# Patient Record
Sex: Male | Born: 1935 | Race: White | Hispanic: No | Marital: Married | Smoking: Never smoker
Health system: Southern US, Community
[De-identification: ages and names within clinical notes are randomized; demographics above are authoritative.]

## PROBLEM LIST (undated history)

## (undated) DIAGNOSIS — C649 Malignant neoplasm of unspecified kidney, except renal pelvis: Secondary | ICD-10-CM

## (undated) DIAGNOSIS — R413 Other amnesia: Secondary | ICD-10-CM

## (undated) DIAGNOSIS — B009 Herpesviral infection, unspecified: Secondary | ICD-10-CM

## (undated) DIAGNOSIS — G309 Alzheimer's disease, unspecified: Secondary | ICD-10-CM

## (undated) DIAGNOSIS — E119 Type 2 diabetes mellitus without complications: Secondary | ICD-10-CM

## (undated) DIAGNOSIS — F0280 Dementia in other diseases classified elsewhere without behavioral disturbance: Secondary | ICD-10-CM

## (undated) DIAGNOSIS — Z8614 Personal history of Methicillin resistant Staphylococcus aureus infection: Secondary | ICD-10-CM

## (undated) DIAGNOSIS — E785 Hyperlipidemia, unspecified: Secondary | ICD-10-CM

## (undated) HISTORY — DX: Type 2 diabetes mellitus without complications: E11.9

## (undated) HISTORY — PX: TONSILLECTOMY: SUR1361

## (undated) HISTORY — DX: Malignant neoplasm of unspecified kidney, except renal pelvis: C64.9

## (undated) HISTORY — PX: NEPHRECTOMY: SHX65

## (undated) HISTORY — DX: Personal history of Methicillin resistant Staphylococcus aureus infection: Z86.14

## (undated) HISTORY — DX: Herpesviral infection, unspecified: B00.9

## (undated) HISTORY — DX: Alzheimer's disease, unspecified: G30.9

## (undated) HISTORY — DX: Hyperlipidemia, unspecified: E78.5

## (undated) HISTORY — DX: Dementia in other diseases classified elsewhere, unspecified severity, without behavioral disturbance, psychotic disturbance, mood disturbance, and anxiety: F02.80

## (undated) HISTORY — DX: Other amnesia: R41.3

---

## 2004-08-06 ENCOUNTER — Ambulatory Visit: Payer: Self-pay | Admitting: Internal Medicine

## 2004-08-16 ENCOUNTER — Ambulatory Visit: Payer: Self-pay | Admitting: Internal Medicine

## 2004-12-19 ENCOUNTER — Ambulatory Visit: Payer: Self-pay | Admitting: Internal Medicine

## 2005-01-01 ENCOUNTER — Ambulatory Visit: Payer: Self-pay | Admitting: Internal Medicine

## 2005-01-10 ENCOUNTER — Ambulatory Visit: Payer: Self-pay | Admitting: Internal Medicine

## 2005-02-14 ENCOUNTER — Ambulatory Visit: Payer: Self-pay | Admitting: Internal Medicine

## 2005-03-21 ENCOUNTER — Ambulatory Visit: Payer: Self-pay | Admitting: Internal Medicine

## 2005-06-20 ENCOUNTER — Ambulatory Visit: Payer: Self-pay | Admitting: Internal Medicine

## 2005-07-18 ENCOUNTER — Ambulatory Visit: Payer: Self-pay | Admitting: Gastroenterology

## 2005-08-01 ENCOUNTER — Encounter: Payer: Self-pay | Admitting: Internal Medicine

## 2005-08-01 ENCOUNTER — Ambulatory Visit: Payer: Self-pay | Admitting: Gastroenterology

## 2005-08-01 ENCOUNTER — Encounter (INDEPENDENT_AMBULATORY_CARE_PROVIDER_SITE_OTHER): Payer: Self-pay | Admitting: *Deleted

## 2005-08-26 ENCOUNTER — Ambulatory Visit: Payer: Self-pay | Admitting: Internal Medicine

## 2005-12-19 ENCOUNTER — Ambulatory Visit: Payer: Self-pay | Admitting: Internal Medicine

## 2006-03-20 ENCOUNTER — Ambulatory Visit: Payer: Self-pay | Admitting: Internal Medicine

## 2006-05-01 ENCOUNTER — Ambulatory Visit: Payer: Self-pay | Admitting: Internal Medicine

## 2006-05-29 ENCOUNTER — Ambulatory Visit: Payer: Self-pay | Admitting: Internal Medicine

## 2006-07-17 ENCOUNTER — Ambulatory Visit: Payer: Self-pay | Admitting: Internal Medicine

## 2006-07-17 LAB — CONVERTED CEMR LAB
Creatinine, Ser: 1 mg/dL (ref 0.4–1.5)
Hgb A1c MFr Bld: 7.4 % — ABNORMAL HIGH (ref 4.6–6.0)

## 2006-12-25 ENCOUNTER — Ambulatory Visit: Payer: Self-pay | Admitting: Internal Medicine

## 2006-12-25 LAB — CONVERTED CEMR LAB
ALT: 21 units/L (ref 0–40)
AST: 34 units/L (ref 0–37)
Albumin: 3.8 g/dL (ref 3.5–5.2)
Basophils Absolute: 0 10*3/uL (ref 0.0–0.1)
Basophils Relative: 1 % (ref 0.0–1.0)
CO2: 30 meq/L (ref 19–32)
Cholesterol: 184 mg/dL (ref 0–200)
Creatinine, Ser: 1 mg/dL (ref 0.4–1.5)
Eosinophils Absolute: 0.3 10*3/uL (ref 0.0–0.6)
HDL: 49.4 mg/dL (ref >39.0)
Hemoglobin: 13.3 g/dL (ref 13.0–17.0)
Hgb A1c MFr Bld: 7.4 % — ABNORMAL HIGH (ref 4.6–6.0)
LDL Cholesterol: 116 mg/dL — ABNORMAL HIGH (ref 0–99)
Lymphocytes Relative: 30.4 % (ref 12.0–46.0)
MCHC: 34.5 g/dL (ref 30.0–36.0)
MCV: 99.7 fL (ref 78.0–100.0)
Microalb Creat Ratio: 2.6 mg/g (ref 0.0–30.0)
Microalb, Ur: 0.3 mg/dL (ref 0.0–1.9)
Monocytes Absolute: 0.4 10*3/uL (ref 0.2–0.7)
Monocytes Relative: 9.5 % (ref 3.0–11.0)
Neutrophils Relative %: 52.2 % (ref 43.0–77.0)
PSA: 1.58 ng/mL (ref 0.10–4.00)
Platelets: 197 10*3/uL (ref 150–400)
RDW: 12.5 % (ref 11.5–14.6)
TSH: 1.99 microintl units/mL (ref 0.35–5.50)

## 2007-01-13 ENCOUNTER — Ambulatory Visit: Payer: Self-pay | Admitting: Internal Medicine

## 2007-02-13 DIAGNOSIS — B009 Herpesviral infection, unspecified: Secondary | ICD-10-CM

## 2007-02-19 ENCOUNTER — Ambulatory Visit: Payer: Self-pay | Admitting: Internal Medicine

## 2007-02-19 DIAGNOSIS — L600 Ingrowing nail: Secondary | ICD-10-CM | POA: Insufficient documentation

## 2007-02-19 DIAGNOSIS — E1165 Type 2 diabetes mellitus with hyperglycemia: Secondary | ICD-10-CM | POA: Insufficient documentation

## 2007-02-19 DIAGNOSIS — E785 Hyperlipidemia, unspecified: Secondary | ICD-10-CM

## 2007-03-11 ENCOUNTER — Encounter: Payer: Self-pay | Admitting: Internal Medicine

## 2007-03-21 ENCOUNTER — Encounter: Payer: Self-pay | Admitting: Internal Medicine

## 2007-09-08 ENCOUNTER — Ambulatory Visit: Payer: Self-pay | Admitting: Internal Medicine

## 2007-09-08 DIAGNOSIS — T887XXA Unspecified adverse effect of drug or medicament, initial encounter: Secondary | ICD-10-CM

## 2007-09-08 LAB — CONVERTED CEMR LAB
BUN: 16 mg/dL (ref 6–23)
Basophils Relative: 1.2 % — ABNORMAL HIGH (ref 0.0–1.0)
CO2: 31 meq/L (ref 19–32)
Calcium: 9.7 mg/dL (ref 8.4–10.5)
Creatinine, Ser: 1 mg/dL (ref 0.4–1.5)
GFR calc Af Amer: 95 mL/min
Monocytes Relative: 9.2 % (ref 3.0–11.0)
Platelets: 190 10*3/uL (ref 150–400)
RBC: 4.08 M/uL — ABNORMAL LOW (ref 4.22–5.81)
RDW: 12.7 % (ref 11.5–14.6)

## 2008-01-21 ENCOUNTER — Ambulatory Visit: Payer: Self-pay | Admitting: Internal Medicine

## 2008-01-21 DIAGNOSIS — R972 Elevated prostate specific antigen [PSA]: Secondary | ICD-10-CM

## 2008-01-28 ENCOUNTER — Ambulatory Visit: Payer: Self-pay | Admitting: Internal Medicine

## 2008-03-08 ENCOUNTER — Encounter: Payer: Self-pay | Admitting: Internal Medicine

## 2008-07-26 ENCOUNTER — Ambulatory Visit: Payer: Self-pay | Admitting: Internal Medicine

## 2008-07-26 DIAGNOSIS — F0280 Dementia in other diseases classified elsewhere without behavioral disturbance: Secondary | ICD-10-CM | POA: Insufficient documentation

## 2008-07-26 DIAGNOSIS — G309 Alzheimer's disease, unspecified: Secondary | ICD-10-CM

## 2009-02-28 ENCOUNTER — Ambulatory Visit: Payer: Self-pay | Admitting: Internal Medicine

## 2009-03-02 ENCOUNTER — Encounter: Admission: RE | Admit: 2009-03-02 | Discharge: 2009-03-02 | Payer: Self-pay | Admitting: Internal Medicine

## 2009-03-07 ENCOUNTER — Ambulatory Visit: Payer: Self-pay | Admitting: Internal Medicine

## 2009-03-07 LAB — CONVERTED CEMR LAB: Blood Glucose, Fingerstick: 450

## 2009-03-14 ENCOUNTER — Ambulatory Visit: Payer: Self-pay | Admitting: Internal Medicine

## 2009-03-21 ENCOUNTER — Telehealth: Payer: Self-pay | Admitting: Internal Medicine

## 2009-03-27 ENCOUNTER — Encounter: Payer: Self-pay | Admitting: Internal Medicine

## 2009-04-04 ENCOUNTER — Ambulatory Visit: Payer: Self-pay | Admitting: Internal Medicine

## 2009-05-09 ENCOUNTER — Encounter: Payer: Self-pay | Admitting: Internal Medicine

## 2009-05-11 ENCOUNTER — Telehealth: Payer: Self-pay | Admitting: Internal Medicine

## 2009-05-29 ENCOUNTER — Encounter (INDEPENDENT_AMBULATORY_CARE_PROVIDER_SITE_OTHER): Payer: Self-pay | Admitting: *Deleted

## 2009-06-06 ENCOUNTER — Ambulatory Visit: Payer: Self-pay | Admitting: Internal Medicine

## 2009-06-06 LAB — CONVERTED CEMR LAB
Alkaline Phosphatase: 71 units/L (ref 39–117)
BUN: 19 mg/dL (ref 6–23)
Bilirubin, Direct: 0.2 mg/dL (ref 0.0–0.3)
CO2: 29 meq/L (ref 19–32)
Chloride: 107 meq/L (ref 96–112)
Creatinine, Ser: 1.2 mg/dL (ref 0.4–1.5)
Folate: 13.5 ng/mL
Glucose, Bld: 143 mg/dL — ABNORMAL HIGH (ref 70–99)
Hgb A1c MFr Bld: 7.3 % — ABNORMAL HIGH (ref 4.6–6.5)
Potassium: 4.4 meq/L (ref 3.5–5.1)
Total Bilirubin: 1.6 mg/dL — ABNORMAL HIGH (ref 0.3–1.2)
Total Protein: 7.2 g/dL (ref 6.0–8.3)

## 2009-08-08 ENCOUNTER — Ambulatory Visit: Payer: Self-pay | Admitting: Internal Medicine

## 2009-08-08 DIAGNOSIS — E162 Hypoglycemia, unspecified: Secondary | ICD-10-CM | POA: Insufficient documentation

## 2009-08-08 LAB — CONVERTED CEMR LAB: Hgb A1c MFr Bld: 7.2 % — ABNORMAL HIGH (ref 4.6–6.5)

## 2009-08-15 ENCOUNTER — Encounter: Admission: RE | Admit: 2009-08-15 | Discharge: 2009-08-15 | Payer: Self-pay | Admitting: Internal Medicine

## 2009-08-16 ENCOUNTER — Encounter: Payer: Self-pay | Admitting: Internal Medicine

## 2009-08-21 ENCOUNTER — Telehealth: Payer: Self-pay | Admitting: Internal Medicine

## 2009-09-05 ENCOUNTER — Ambulatory Visit: Payer: Self-pay | Admitting: Internal Medicine

## 2009-09-05 DIAGNOSIS — L719 Rosacea, unspecified: Secondary | ICD-10-CM

## 2009-10-24 ENCOUNTER — Telehealth: Payer: Self-pay | Admitting: Internal Medicine

## 2009-10-31 ENCOUNTER — Encounter: Payer: Self-pay | Admitting: Internal Medicine

## 2009-11-28 ENCOUNTER — Ambulatory Visit: Payer: Self-pay | Admitting: Internal Medicine

## 2009-11-28 DIAGNOSIS — Z8614 Personal history of Methicillin resistant Staphylococcus aureus infection: Secondary | ICD-10-CM | POA: Insufficient documentation

## 2009-11-28 HISTORY — DX: Personal history of Methicillin resistant Staphylococcus aureus infection: Z86.14

## 2009-12-22 ENCOUNTER — Telehealth: Payer: Self-pay | Admitting: Internal Medicine

## 2010-01-08 ENCOUNTER — Telehealth: Payer: Self-pay | Admitting: Internal Medicine

## 2010-01-20 ENCOUNTER — Encounter: Payer: Self-pay | Admitting: Internal Medicine

## 2010-01-23 ENCOUNTER — Ambulatory Visit: Payer: Self-pay | Admitting: Internal Medicine

## 2010-01-23 DIAGNOSIS — IMO0002 Reserved for concepts with insufficient information to code with codable children: Secondary | ICD-10-CM | POA: Insufficient documentation

## 2010-01-23 DIAGNOSIS — R3919 Other difficulties with micturition: Secondary | ICD-10-CM | POA: Insufficient documentation

## 2010-01-23 DIAGNOSIS — K3184 Gastroparesis: Secondary | ICD-10-CM

## 2010-01-23 LAB — CONVERTED CEMR LAB
ALT: 24 units/L (ref 0–53)
AST: 31 units/L (ref 0–37)
Alkaline Phosphatase: 79 units/L (ref 39–117)
BUN: 24 mg/dL — ABNORMAL HIGH (ref 6–23)
Basophils Absolute: 0 10*3/uL (ref 0.0–0.1)
Bilirubin Urine: NEGATIVE
Chloride: 107 meq/L (ref 96–112)
Creatinine, Ser: 0.9 mg/dL (ref 0.4–1.5)
Eosinophils Relative: 3.1 % (ref 0.0–5.0)
GFR calc non Af Amer: 90.45 mL/min (ref >60)
Hemoglobin: 14.5 g/dL (ref 13.0–17.0)
Hgb A1c MFr Bld: 8.9 % — ABNORMAL HIGH (ref 4.6–6.5)
Ketones, urine, test strip: NEGATIVE
Lymphocytes Relative: 19.8 % (ref 12.0–46.0)
Monocytes Relative: 7.4 % (ref 3.0–12.0)
Nitrite: NEGATIVE
Platelets: 196 10*3/uL (ref 150.0–400.0)
Potassium: 4.4 meq/L (ref 3.5–5.1)
RDW: 14.1 % (ref 11.5–14.6)
Specific Gravity, Urine: >=1.03
Total Bilirubin: 1.6 mg/dL — ABNORMAL HIGH (ref 0.3–1.2)
WBC: 6.2 10*3/uL (ref 4.5–10.5)
pH: 5.5

## 2010-01-31 ENCOUNTER — Encounter: Payer: Self-pay | Admitting: Internal Medicine

## 2010-01-31 ENCOUNTER — Encounter: Admission: RE | Admit: 2010-01-31 | Discharge: 2010-02-22 | Payer: Self-pay | Admitting: Internal Medicine

## 2010-02-14 ENCOUNTER — Telehealth: Payer: Self-pay | Admitting: Internal Medicine

## 2010-02-15 ENCOUNTER — Ambulatory Visit: Payer: Self-pay | Admitting: Family Medicine

## 2010-02-15 ENCOUNTER — Encounter: Payer: Self-pay | Admitting: Internal Medicine

## 2010-02-15 DIAGNOSIS — Z9189 Other specified personal risk factors, not elsewhere classified: Secondary | ICD-10-CM

## 2010-02-16 ENCOUNTER — Ambulatory Visit: Payer: Self-pay | Admitting: Cardiovascular Disease

## 2010-02-16 ENCOUNTER — Telehealth (INDEPENDENT_AMBULATORY_CARE_PROVIDER_SITE_OTHER): Payer: Self-pay | Admitting: *Deleted

## 2010-02-16 ENCOUNTER — Ambulatory Visit: Payer: Self-pay | Admitting: Family Medicine

## 2010-02-16 DIAGNOSIS — L259 Unspecified contact dermatitis, unspecified cause: Secondary | ICD-10-CM

## 2010-02-16 DIAGNOSIS — K5900 Constipation, unspecified: Secondary | ICD-10-CM | POA: Insufficient documentation

## 2010-02-16 DIAGNOSIS — C649 Malignant neoplasm of unspecified kidney, except renal pelvis: Secondary | ICD-10-CM

## 2010-02-19 ENCOUNTER — Ambulatory Visit: Payer: Self-pay | Admitting: Family Medicine

## 2010-03-14 ENCOUNTER — Encounter: Payer: Self-pay | Admitting: Internal Medicine

## 2010-03-25 ENCOUNTER — Emergency Department (HOSPITAL_COMMUNITY): Admission: EM | Admit: 2010-03-25 | Discharge: 2010-03-25 | Payer: Self-pay | Admitting: Emergency Medicine

## 2010-03-26 ENCOUNTER — Ambulatory Visit (HOSPITAL_COMMUNITY): Admission: RE | Admit: 2010-03-26 | Discharge: 2010-03-26 | Payer: Self-pay | Admitting: Urology

## 2010-04-11 ENCOUNTER — Encounter: Payer: Self-pay | Admitting: Urology

## 2010-04-11 ENCOUNTER — Inpatient Hospital Stay (HOSPITAL_COMMUNITY): Admission: RE | Admit: 2010-04-11 | Discharge: 2010-04-13 | Payer: Self-pay | Admitting: Urology

## 2010-04-11 ENCOUNTER — Encounter: Payer: Self-pay | Admitting: Internal Medicine

## 2010-04-20 ENCOUNTER — Encounter: Payer: Self-pay | Admitting: Internal Medicine

## 2010-05-23 ENCOUNTER — Encounter: Payer: Self-pay | Admitting: Internal Medicine

## 2010-06-28 ENCOUNTER — Ambulatory Visit: Payer: Self-pay | Admitting: Internal Medicine

## 2010-07-25 ENCOUNTER — Encounter: Payer: Self-pay | Admitting: Gastroenterology

## 2010-08-09 ENCOUNTER — Encounter: Payer: Self-pay | Admitting: Internal Medicine

## 2010-08-12 LAB — CONVERTED CEMR LAB
ALT: 22 units/L (ref 0–53)
ALT: 23 units/L (ref 0–53)
AST: 28 units/L (ref 0–37)
BUN: 14 mg/dL (ref 6–23)
Basophils Absolute: 0 10*3/uL (ref 0.0–0.1)
Basophils Relative: 0.8 % (ref 0.0–3.0)
Bilirubin Urine: NEGATIVE
Bilirubin, Direct: 0 mg/dL (ref 0.0–0.3)
Bilirubin, Direct: 0.1 mg/dL (ref 0.0–0.3)
CO2: 29 meq/L (ref 19–32)
CO2: 31 meq/L (ref 19–32)
CRP, High Sensitivity: <1 (ref 0.00–5.00)
Calcium: 9.2 mg/dL (ref 8.4–10.5)
Chloride: 105 meq/L (ref 96–112)
Cholesterol: 157 mg/dL (ref 0–200)
Creatinine, Ser: 0.9 mg/dL (ref 0.4–1.5)
Eosinophils Relative: 5.1 % — ABNORMAL HIGH (ref 0.0–5.0)
GFR calc Af Amer: 107 mL/min
GFR calc non Af Amer: 89 mL/min
HCT: 42.4 % (ref 39.0–52.0)
HDL: 56.2 mg/dL (ref >39.00)
Hemoglobin: 13.5 g/dL (ref 13.0–17.0)
Hgb A1c MFr Bld: 8.9 % — ABNORMAL HIGH (ref 4.6–6.0)
LDL Cholesterol: 96 mg/dL (ref 0–99)
Lymphocytes Relative: 26.4 % (ref 12.0–46.0)
MCHC: 35.3 g/dL (ref 30.0–36.0)
MCV: 102 fL — ABNORMAL HIGH (ref 78.0–100.0)
Microalb Creat Ratio: 3.6 mg/g (ref 0.0–30.0)
Microalb, Ur: 0.2 mg/dL (ref 0.0–1.9)
Monocytes Absolute: 0.4 10*3/uL (ref 0.1–1.0)
Monocytes Relative: 9.1 % (ref 3.0–12.0)
Neutro Abs: 2.5 10*3/uL (ref 1.4–7.7)
Neutrophils Relative %: 59.2 % (ref 43.0–77.0)
PSA: 1.01 ng/mL (ref 0.10–4.00)
PSA: 1.42 ng/mL (ref 0.10–4.00)
RBC: 4.15 M/uL — ABNORMAL LOW (ref 4.22–5.81)
RDW: 12.8 % (ref 11.5–14.6)
Sodium: 140 meq/L (ref 135–145)
TSH: 1.71 microintl units/mL (ref 0.35–5.50)
Total Bilirubin: 1.5 mg/dL — ABNORMAL HIGH (ref 0.3–1.2)
Total Bilirubin: 1.5 mg/dL — ABNORMAL HIGH (ref 0.3–1.2)
Total Protein, Urine: NEGATIVE mg/dL
Total Protein: 6.9 g/dL (ref 6.0–8.3)
Triglycerides: 61 mg/dL (ref 0–149)
Urine Glucose: >=1000 mg/dL
VLDL: 12 mg/dL (ref 0–40)
Vitamin B-12: 993 pg/mL — ABNORMAL HIGH (ref 211–911)
WBC: 4.5 10*3/uL (ref 4.5–10.5)
pH: 5 (ref 5.0–8.0)

## 2010-08-14 NOTE — Assessment & Plan Note (Signed)
Summary: 1 month fup//ccm   Vital Signs:  Patient profile:   75 year old male Height:      69 inches Weight:      184 pounds BMI:     27.27 Temp:     98.2 degrees F oral Pulse rate:   72 / minute Resp:     14 per minute BP sitting:   129 / 72  (left arm)  Vitals Entered By: Willy Eddy, LPN (September 05, 2009 9:10 AM) CC: roa- pt states he is taking a phenergan supp. about every day to nausea and he takes with him if gets nauseated Pain Assessment Patient in pain? no       Does patient need assistance? Functional Status Self care, Cook/clean, Shopping, Social activities Ambulation Normal   Primary Care Provider:  Stacie Glaze MD  CC:  roa- pt states he is taking a phenergan supp. about every day to nausea and he takes with him if gets nauseated.  History of Present Illness: When to the [facility] for training but did not recall the event fully ( OBS) he brings a record of CBG's most are +- 20 of 100! bed time snaks help him to avoid AM lows one treatable low was reported hx of type 2   Preventive Screening-Counseling & Management  Alcohol-Tobacco     Smoking Status: never  Problems Prior to Update: 1)  Hypoglycemia, Unspecified  (ICD-251.2) 2)  Memory Loss  (ICD-780.93) 3)  Physical Examination  (ICD-V70.0) 4)  Memory Loss  (ICD-780.93) 5)  Elevated Prostate Specific Antigen  (ICD-790.93) 6)  Uns Advrs Eff Uns Rx Medicinal&biological Sbstnc  (ICD-995.20) 7)  Uns Advrs Eff Uns Rx Medicinal&biological Sbstnc  (ICD-995.20) 8)  Ingrown Toenail  (ICD-703.0) 9)  Hyperlipidemia  (ICD-272.4) 10)  Diabetes Mellitus, Type II  (ICD-250.00) 11)  Hsv  (ICD-054.9)  Current Problems (verified): 1)  Hypoglycemia, Unspecified  (ICD-251.2) 2)  Memory Loss  (ICD-780.93) 3)  Physical Examination  (ICD-V70.0) 4)  Memory Loss  (ICD-780.93) 5)  Elevated Prostate Specific Antigen  (ICD-790.93) 6)  Uns Advrs Eff Uns Rx Medicinal&biological Sbstnc  (ICD-995.20) 7)  Uns  Advrs Eff Uns Rx Medicinal&biological Sbstnc  (ICD-995.20) 8)  Ingrown Toenail  (ICD-703.0) 9)  Hyperlipidemia  (ICD-272.4) 10)  Diabetes Mellitus, Type II  (ICD-250.00) 11)  Hsv  (ICD-054.9)  Medications Prior to Update: 1)  Glyburide-Metformin 2.5-500 Mg  Tabs (Glyburide-Metformin) .... Take Two Tablets Daily 2)  Actos 45 Mg  Tabs (Pioglitazone Hcl) .... Take 1 Tablet By Mouth Once A Day 3)  Vytorin 10-40 Mg  Tabs (Ezetimibe-Simvastatin) .... Take 1 Tablet By Mouth At Bedtime 4)  Valtrex 1 Gm  Tabs (Valacyclovir Hcl) .... Once Daily 5)  Niacin 500 Mg  Tabs (Niacin) .... Take 1 Tablet By Mouth Once A Day 6)  Aspirin 325 Mg  Tabs (Aspirin) .... Take 1 Tablet By Mouth Once A Day 7)  Folic Acid 400 Mcg  Tabs (Folic Acid) .... Take 1 Tablet By Mouth Once A Day 8)  L-Lysine Hcl 500 Mg  Tabs (Lysine Hcl) .... Take 1 Tablet By Mouth Once A Day 9)  Vitamin C 500 Mg  Tabs (Ascorbic Acid) .... Once Daily 10)  Potassium Gluconate 550 Mg  Tabs (Potassium Gluconate) .... Once Daily 11)  Fish Oil 1000 Mg  Caps (Omega-3 Fatty Acids) .... Once Daily 12)  Aricept 10 Mg Tabs (Donepezil Hcl) .... One By Mouth Daily 13)  Lotrisone 1-0.05 % Crea (Clotrimazole-Betamethasone) .... Apply  To Rash Bid 14)  Lantus Solostar 100 Unit/ml Soln (Insulin Glargine) .... 20 U Subcutaneously With Dinner Meal 15)  Pen Needles 31g X 8 Mm Misc (Insulin Pen Needle) .... To Use As Direct With Lantus Solostar 16)  Onetouch Ultra Test  Strp (Glucose Blood) .... Marland Kitchenuse As Directed 17)  Ciclopirox 8 % Soln (Ciclopirox) .... Use As Directed 18)  Promethazine Hcl 25 Mg Supp (Promethazine Hcl) .Marland Kitchen.. 1 Every 6-8  Hourts As Needed Nausea  Current Medications (verified): 1)  Glyburide-Metformin 2.5-500 Mg  Tabs (Glyburide-Metformin) .... Take Two Tablets Daily 2)  Actos 45 Mg  Tabs (Pioglitazone Hcl) .... Take 1 Tablet By Mouth Once A Day 3)  Vytorin 10-40 Mg  Tabs (Ezetimibe-Simvastatin) .... Take 1 Tablet By Mouth At Bedtime 4)  Valtrex  1 Gm  Tabs (Valacyclovir Hcl) .... Once Daily 5)  Niacin 500 Mg  Tabs (Niacin) .... Take 1 Tablet By Mouth Once A Day 6)  Aspirin 325 Mg  Tabs (Aspirin) .... Take 1 Tablet By Mouth Once A Day 7)  Folic Acid 400 Mcg  Tabs (Folic Acid) .... Take 1 Tablet By Mouth Once A Day 8)  L-Lysine Hcl 500 Mg  Tabs (Lysine Hcl) .... Take 1 Tablet By Mouth Once A Day 9)  Vitamin C 500 Mg  Tabs (Ascorbic Acid) .... Once Daily 10)  Potassium Gluconate 550 Mg  Tabs (Potassium Gluconate) .... Once Daily 11)  Fish Oil 1000 Mg  Caps (Omega-3 Fatty Acids) .... Once Daily 12)  Aricept 10 Mg Tabs (Donepezil Hcl) .... One By Mouth Daily 13)  Lotrisone 1-0.05 % Crea (Clotrimazole-Betamethasone) .... Apply To Rash Bid 14)  Lantus Solostar 100 Unit/ml Soln (Insulin Glargine) .... 20 U Subcutaneously With Dinner Meal 15)  Pen Needles 31g X 8 Mm Misc (Insulin Pen Needle) .... To Use As Direct With Lantus Solostar 16)  Onetouch Ultra Test  Strp (Glucose Blood) .... Marland Kitchenuse As Directed 17)  Ciclopirox 8 % Soln (Ciclopirox) .... Use As Directed 18)  Promethazine Hcl 25 Mg Supp (Promethazine Hcl) .Marland Kitchen.. 1 Every 6-8  Hourts As Needed Nausea  Allergies (verified): No Known Drug Allergies  Past History:  Family History: Last updated: 01/21/2008 father CVA at age 21 mother died at 68   alzheimers sister breast cancer  Social History: Last updated: 01/21/2008 Never Smoked Retired Alcohol use-yes Drug use-no Regular exercise-yes  Risk Factors: Exercise: yes (01/21/2008)  Risk Factors: Smoking Status: never (09/05/2009)  Past medical, surgical, family and social histories (including risk factors) reviewed, and no changes noted (except as noted below).  Past Medical History: Reviewed history from 02/19/2007 and no changes required. Diabetes mellitus, type II Hyperlipidemia HSV  Past Surgical History: Reviewed history from 02/13/2007 and no changes required. Colonoscopy-08/01/2005  Family History: Reviewed  history from 01/21/2008 and no changes required. father CVA at age 61 mother died at 81   alzheimers sister breast cancer  Social History: Reviewed history from 01/21/2008 and no changes required. Never Smoked Retired Alcohol use-yes Drug use-no Regular exercise-yes  Review of Systems  The patient denies anorexia, fever, weight loss, weight gain, vision loss, decreased hearing, hoarseness, chest pain, syncope, dyspnea on exertion, peripheral edema, prolonged cough, headaches, hemoptysis, abdominal pain, melena, hematochezia, severe indigestion/heartburn, hematuria, incontinence, genital sores, muscle weakness, suspicious skin lesions, transient blindness, difficulty walking, depression, unusual weight change, abnormal bleeding, enlarged lymph nodes, angioedema, breast masses, and testicular masses.         get angrey with memory loss  Physical Exam  General:  Well-developed,well-nourished,in no acute distress; alert,appropriate and cooperative throughout examination Head:  normocephalic and atraumatic.   Eyes:  pupils equal, pupils round, and pupils reactive to light.   Ears:  R ear normal and L ear normal.   Nose:  no external deformity and no nasal discharge.   Neck:  supple and full ROM.   Lungs:  normal respiratory effort and no intercostal retractions.   Heart:  normal rate and regular rhythm.   Abdomen:  Bowel sounds positive,abdomen soft and non-tender without masses, organomegaly or hernias noted. Msk:  No deformity or scoliosis noted of thoracic or lumbar spine.   Pulses:  R and L carotid,radial,femoral,dorsalis pedis and posterior tibial pulses are full and equal bilaterally Extremities:  No clubbing, cyanosis, edema, or deformity noted with normal full range of motion of all joints.  fungal nail and ingrow nail on left foot Neurologic:  alert & oriented X3 and DTRs symmetrical and normal.     Impression & Recommendations:  Problem # 1:  DIABETES MELLITUS, TYPE II  (ICD-250.00)  His updated medication list for this problem includes:    Glyburide-metformin 2.5-500 Mg Tabs (Glyburide-metformin) .Marland Kitchen... Take two tablets daily    Actos 45 Mg Tabs (Pioglitazone hcl) .Marland Kitchen... Take 1 tablet by mouth once a day    Aspirin 325 Mg Tabs (Aspirin) .Marland Kitchen... Take 1 tablet by mouth once a day    Lantus Solostar 100 Unit/ml Soln (Insulin glargine) .Marland Kitchen... 20 u subcutaneously with dinner meal  Labs Reviewed: Creat: 1.2 (06/06/2009)     Last Eye Exam: normal (03/01/2009) Reviewed HgBA1c results: 7.2 (08/08/2009)  7.3 (06/06/2009)  Problem # 2:  MEMORY LOSS (ICD-780.93) on aricept  Problem # 3:  HYPERLIPIDEMIA (ICD-272.4)  His updated medication list for this problem includes:    Vytorin 10-40 Mg Tabs (Ezetimibe-simvastatin) .Marland Kitchen... Take 1 tablet by mouth at bedtime    Niacin 500 Mg Tabs (Niacin) .Marland Kitchen... Take 1 tablet by mouth once a day  Labs Reviewed: SGOT: 50 (06/06/2009)   SGPT: 32 (06/06/2009)  Lipid Goals: Chol Goal: 200 (07/26/2008)   HDL Goal: 40 (07/26/2008)   LDL Goal: 100 (07/26/2008)   TG Goal: 150 (07/26/2008)  Prior 10 Yr Risk Heart Disease: 14 % (06/06/2009)   HDL:56.20 (02/28/2009), 48.5 (01/21/2008)  LDL:96 (01/21/2008), 116 (29/56/2130)  Chol:183 (02/28/2009), 157 (01/21/2008)  Trig:61 (01/21/2008), 94 (12/25/2006)  Problem # 4:  ACNE ROSACEA (ICD-695.3)  cleocin lotion  Orders: Prescription Created Electronically [phone])  Complete Medication List: 1)  Glyburide-metformin 2.5-500 Mg Tabs (Glyburide-metformin) .... Take two tablets daily 2)  Actos 45 Mg Tabs (Pioglitazone hcl) .... Take 1 tablet by mouth once a day 3)  Vytorin 10-40 Mg Tabs (Ezetimibe-simvastatin) .... Take 1 tablet by mouth at bedtime 4)  Valtrex 1 Gm Tabs (Valacyclovir hcl) .... Once daily 5)  Niacin 500 Mg Tabs (Niacin) .... Take 1 tablet by mouth once a day 6)  Aspirin 325 Mg Tabs (Aspirin) .... Take 1 tablet by mouth once a day 7)  Folic Acid 400 Mcg Tabs (Folic acid) ....  Take 1 tablet by mouth once a day 8)  L-lysine Hcl 500 Mg Tabs (Lysine hcl) .... Take 1 tablet by mouth once a day 9)  Vitamin C 500 Mg Tabs (Ascorbic acid) .... Once daily 10)  Potassium Gluconate 550 Mg Tabs (Potassium gluconate) .... Once daily 11)  Fish Oil 1000 Mg Caps (Omega-3 fatty acids) .... Once daily 12)  Aricept 10 Mg Tabs (Donepezil hcl) .... One by mouth daily 13)  Lotrisone  1-0.05 % Crea (Clotrimazole-betamethasone) .... Apply to rash bid 14)  Lantus Solostar 100 Unit/ml Soln (Insulin glargine) .... 20 u subcutaneously with dinner meal 15)  Pen Needles 31g X 8 Mm Misc (Insulin pen needle) .... To use as direct with lantus solostar 16)  Onetouch Ultra Test Strp (Glucose blood) .... Marland Kitchenuse as directed 17)  Ciclopirox 8 % Soln (Ciclopirox) .... Use as directed 18)  Promethazine Hcl 25 Mg Supp (Promethazine hcl) .Marland Kitchen.. 1 every 6-8  hourts as needed nausea 19)  Cleocin-t 1 % Gel (Clindamycin phosphate) .... Aplly to face two times a day at afect areas  Patient Instructions: 1)  Please schedule a follow-up appointment in 2 months. Prescriptions: CLEOCIN-T 1 % GEL (CLINDAMYCIN PHOSPHATE) aplly to face two times a day at afect areas  #30gm x 11   Entered and Authorized by:   Stacie Glaze MD   Signed by:   Stacie Glaze MD on 09/05/2009   Method used:   [facility]ically to        [phone] (retail[facility]       [address]       [place], [place]  [address]       Ph: [phone] or [phone]       Fax: [phone]   RxID:   [id number]

## 2010-08-14 NOTE — Progress Notes (Signed)
Summary: Pt called re: Valacyclovir HCL. Should he continue this med  Phone Note Call from Patient Call back at [facility] Phone [phone]   Caller: Patient Summary of Call: Pt called re: Valacyclovir HCL. Pt needs to know if he needs to continue taking this med or not. Also, pt says that there is a tablet that is white with black letters, that has a M122 on it , and he doesn't know what the med is, but he needs a refill on it. Pls call asap.  Initial call taken by: Lucy Antigua,  February 14, 2010 11:30 AM  Follow-up for Phone Call        continue and take unknown med to pharmacist and ask them the name Follow-up by: Willy Eddy, LPN,  February 14, 2010 12:19 PM

## 2010-08-14 NOTE — Letter (Signed)
Summary: [facility]  [facility]   Imported By: Maryln Gottron 05/30/2010 13:45:07  _____________________________________________________________________  External Attachment:    Type:   Image     Comment:   External Document

## 2010-08-14 NOTE — Progress Notes (Signed)
Summary: Pt req refill of Promethazine Suppository  Phone Note Call from Patient Call back at Home Phone [phone]   Caller: Patient Summary of Call: Pt came by the office to req refill of Promethazine Suppository to [facility]. Pt is completely out.    Initial call taken by: Lucy Antigua,  October 24, 2009 11:31 AM    Prescriptions: PROMETHAZINE HCL 25 MG SUPP (PROMETHAZINE HCL) 1 every 6-8  hourts as needed nausea  #12 Supposito x 0   Entered by:   Willy Eddy, LPN   Authorized by:   Stacie Glaze MD   Signed by:   Willy Eddy, LPN on 09/81/1914   Method used:   Electronically to        [phone] (retail[facility]       [address]       [place], [place]  [address]       Ph: [phone] or [phone]       Fax: [phone]   RxID:   [id number]

## 2010-08-14 NOTE — Letter (Signed)
Summary: Triage Note  Triage Note   Imported By: Maryln Gottron 02/19/2010 10:14:35  _____________________________________________________________________  External Attachment:    Type:   Image     Comment:   External Document

## 2010-08-14 NOTE — Assessment & Plan Note (Signed)
Summary: Wound on hand/cb   Vital Signs:  Patient profile:   75 year old male Height:      89 inches Temp:     98.2 degrees F oral Pulse rate:   72 / minute Resp:     14 per minute BP sitting:   130 / 72  (left arm)  Vitals Entered By: Willy Eddy, LPN (Nov 28, 2009 1:17 PM) CC:  red area around wound on rt left hand-pt doesnt know how it happend, Hypertension Management   Primary Care Provider:  Stacie Glaze MD  CC:   red area around wound on rt left hand-pt doesnt know how it happend and Hypertension Management.  History of Present Illness: There is a red circular leson with scabing in the center there is the apperance of a bite with infection he is a diabetc there is no warmth or streaking he cannot recall the onset the change in medications were discussed from the endocrine consult  Hypertension History:      Positive major cardiovascular risk factors include male age 37 years old or older, diabetes, hyperlipidemia, and hypertension.  Negative major cardiovascular risk factors include non-tobacco-user status.        Further assessment for target organ damage reveals no history of ASHD, stroke/TIA, or peripheral vascular disease.    Preventive Screening-Counseling & Management  Alcohol-Tobacco     Smoking Status: never  Problems Prior to Update: 1)  Acne Rosacea  (ICD-695.3) 2)  Hypoglycemia, Unspecified  (ICD-251.2) 3)  Memory Loss  (ICD-780.93) 4)  Physical Examination  (ICD-V70.0) 5)  Memory Loss  (ICD-780.93) 6)  Elevated Prostate Specific Antigen  (ICD-790.93) 7)  Uns Advrs Eff Uns Rx Medicinal&biological Sbstnc  (ICD-995.20) 8)  Uns Advrs Eff Uns Rx Medicinal&biological Sbstnc  (ICD-995.20) 9)  Ingrown Toenail  (ICD-703.0) 10)  Hyperlipidemia  (ICD-272.4) 11)  Diabetes Mellitus, Type II  (ICD-250.00) 12)  Hsv  (ICD-054.9)  Current Problems (verified): 1)  Acne Rosacea  (ICD-695.3) 2)  Hypoglycemia, Unspecified  (ICD-251.2) 3)  Memory Loss   (ICD-780.93) 4)  Physical Examination  (ICD-V70.0) 5)  Memory Loss  (ICD-780.93) 6)  Elevated Prostate Specific Antigen  (ICD-790.93) 7)  Uns Advrs Eff Uns Rx Medicinal&biological Sbstnc  (ICD-995.20) 8)  Uns Advrs Eff Uns Rx Medicinal&biological Sbstnc  (ICD-995.20) 9)  Ingrown Toenail  (ICD-703.0) 10)  Hyperlipidemia  (ICD-272.4) 11)  Diabetes Mellitus, Type II  (ICD-250.00) 12)  Hsv  (ICD-054.9)  Medications Prior to Update: 1)  Glyburide-Metformin 2.5-500 Mg  Tabs (Glyburide-Metformin) .... Take Two Tablets Daily 2)  Actos 45 Mg  Tabs (Pioglitazone Hcl) .... Take 1 Tablet By Mouth Once A Day 3)  Vytorin 10-40 Mg  Tabs (Ezetimibe-Simvastatin) .... Take 1 Tablet By Mouth At Bedtime 4)  Valtrex 1 Gm  Tabs (Valacyclovir Hcl) .... Once Daily 5)  Niacin 500 Mg  Tabs (Niacin) .... Take 1 Tablet By Mouth Once A Day 6)  Aspirin 325 Mg  Tabs (Aspirin) .... Take 1 Tablet By Mouth Once A Day 7)  Folic Acid 400 Mcg  Tabs (Folic Acid) .... Take 1 Tablet By Mouth Once A Day 8)  L-Lysine Hcl 500 Mg  Tabs (Lysine Hcl) .... Take 1 Tablet By Mouth Once A Day 9)  Vitamin C 500 Mg  Tabs (Ascorbic Acid) .... Once Daily 10)  Potassium Gluconate 550 Mg  Tabs (Potassium Gluconate) .... Once Daily 11)  Fish Oil 1000 Mg  Caps (Omega-3 Fatty Acids) .... Once Daily 12)  Aricept 10  Mg Tabs (Donepezil Hcl) .... One By Mouth Daily 13)  Lotrisone 1-0.05 % Crea (Clotrimazole-Betamethasone) .... Apply To Rash Bid 14)  Lantus Solostar 100 Unit/ml Soln (Insulin Glargine) .... 20 U Subcutaneously With Dinner Meal 15)  Pen Needles 31g X 8 Mm Misc (Insulin Pen Needle) .... To Use As Direct With Lantus Solostar 16)  Onetouch Ultra Test  Strp (Glucose Blood) .... Marland Kitchenuse As Directed 17)  Ciclopirox 8 % Soln (Ciclopirox) .... Use As Directed 18)  Promethazine Hcl 25 Mg Supp (Promethazine Hcl) .Marland Kitchen.. 1 Every 6-8  Hourts As Needed Nausea 19)  Cleocin-T 1 % Gel (Clindamycin Phosphate) .... Aplly To Face Two Times A Day At Afect  Areas  Current Medications (verified): 1)  Glyburide-Metformin 2.5-500 Mg  Tabs (Glyburide-Metformin) .... Take Two Tablets Daily 2)  Actos 45 Mg  Tabs (Pioglitazone Hcl) .... Take 1 Tablet By Mouth Once A Day 3)  Vytorin 10-40 Mg  Tabs (Ezetimibe-Simvastatin) .... Take 1 Tablet By Mouth At Bedtime 4)  Valtrex 1 Gm  Tabs (Valacyclovir Hcl) .... Once Daily 5)  Niacin 500 Mg  Tabs (Niacin) .... Take 1 Tablet By Mouth Once A Day 6)  Aspirin 325 Mg  Tabs (Aspirin) .... Take 1 Tablet By Mouth Once A Day 7)  Folic Acid 400 Mcg  Tabs (Folic Acid) .... Take 1 Tablet By Mouth Once A Day 8)  L-Lysine Hcl 500 Mg  Tabs (Lysine Hcl) .... Take 1 Tablet By Mouth Once A Day 9)  Vitamin C 500 Mg  Tabs (Ascorbic Acid) .... Once Daily 10)  Potassium Gluconate 550 Mg  Tabs (Potassium Gluconate) .... Once Daily 11)  Fish Oil 1000 Mg  Caps (Omega-3 Fatty Acids) .... Once Daily 12)  Aricept 10 Mg Tabs (Donepezil Hcl) .... One By Mouth Daily 13)  Lotrisone 1-0.05 % Crea (Clotrimazole-Betamethasone) .... Apply To Rash Bid 14)  Lantus Solostar 100 Unit/ml Soln (Insulin Glargine) .... 20 U Subcutaneously With Dinner Meal 15)  Pen Needles 31g X 8 Mm Misc (Insulin Pen Needle) .... To Use As Direct With Lantus Solostar 16)  Onetouch Ultra Test  Strp (Glucose Blood) .... Marland Kitchenuse As Directed 17)  Ciclopirox 8 % Soln (Ciclopirox) .... Use As Directed 18)  Promethazine Hcl 25 Mg Supp (Promethazine Hcl) .Marland Kitchen.. 1 Every 6-8  Hourts As Needed Nausea 19)  Cleocin-T 1 % Gel (Clindamycin Phosphate) .... Aplly To Face Two Times A Day At Afect Areas  Allergies (verified): No Known Drug Allergies  Past History:  Family History: Last updated: 01/21/2008 father CVA at age 82 mother died at 32   alzheimers sister breast cancer  Social History: Last updated: 01/21/2008 Never Smoked Retired Alcohol use-yes Drug use-no Regular exercise-yes  Risk Factors: Exercise: yes (01/21/2008)  Risk Factors: Smoking Status: never  (11/28/2009)  Past medical, surgical, family and social histories (including risk factors) reviewed, and no changes noted (except as noted below).  Past Medical History: Reviewed history from 02/19/2007 and no changes required. Diabetes mellitus, type II Hyperlipidemia HSV  Past Surgical History: Reviewed history from 02/13/2007 and no changes required. Colonoscopy-08/01/2005  Family History: Reviewed history from 01/21/2008 and no changes required. father CVA at age 61 mother died at 29   alzheimers sister breast cancer  Social History: Reviewed history from 01/21/2008 and no changes required. Never Smoked Retired Alcohol use-yes Drug use-no Regular exercise-yes  Review of Systems  The patient denies anorexia, fever, weight loss, weight gain, vision loss, decreased hearing, hoarseness, chest pain, syncope, dyspnea on exertion, peripheral edema,  prolonged cough, headaches, hemoptysis, abdominal pain, melena, hematochezia, severe indigestion/heartburn, hematuria, incontinence, genital sores, muscle weakness, suspicious skin lesions, transient blindness, difficulty walking, depression, unusual weight change, abnormal bleeding, enlarged lymph nodes, angioedema, and breast masses.    Physical Exam  General:  Well-developed,well-nourished,in no acute distress; alert,appropriate and cooperative throughout examination Head:  normocephalic and atraumatic.   Eyes:  pupils equal, pupils round, and pupils reactive to light.   Nose:  no external deformity and no nasal discharge.   Mouth:  no posterior lymphoid hypertrophy and no postnasal drip.   Lungs:  normal respiratory effort and no intercostal retractions.   Heart:  normal rate and regular rhythm.   Msk:  No deformity or scoliosis noted of thoracic or lumbar spine.   Pulses:  R and L carotid,radial,femoral,dorsalis pedis and posterior tibial pulses are full and equal bilaterally Extremities:  No clubbing, cyanosis, edema, or  deformity noted with normal full range of motion of all joints.  fungal nail and ingrow nail on left foot Skin:  2cm raised erythematous lesion with scabing at center Cervical Nodes:  No lymphadenopathy noted Axillary Nodes:  No palpable lymphadenopathy   Impression & Recommendations:  Problem # 1:  [name] (ZOX-096.04) I strongly suspect that he has [name] afte an insect bit or scratch risk of DM complicated this will treat emperically with doxy 100 two times a day for 14 days  Complete Medication List: 1)  Actos 45 Mg Tabs (Pioglitazone hcl) .... Take 1 tablet by mouth once a day 2)  Vytorin 10-40 Mg Tabs (Ezetimibe-simvastatin) .... Take 1 tablet by mouth at bedtime 3)  Valtrex 1 Gm Tabs (Valacyclovir hcl) .... Once daily 4)  Niacin 500 Mg Tabs (Niacin) .... Take 1 tablet by mouth once a day 5)  Aspirin 325 Mg Tabs (Aspirin) .... Take 1 tablet by mouth once a day 6)  Folic Acid 400 Mcg Tabs (Folic acid) .... Take 1 tablet by mouth once a day 7)  L-lysine Hcl 500 Mg Tabs (Lysine hcl) .... Take 1 tablet by mouth once a day 8)  Vitamin C 500 Mg Tabs (Ascorbic acid) .... Once daily 9)  Potassium Gluconate 550 Mg Tabs (Potassium gluconate) .... Once daily 10)  Fish Oil 1000 Mg Caps (Omega-3 fatty acids) .... Once daily 11)  Aricept 10 Mg Tabs (Donepezil hcl) .... One by mouth daily 12)  Lotrisone 1-0.05 % Crea (Clotrimazole-betamethasone) .... Apply to rash bid 13)  Lantus Solostar 100 Unit/ml Soln (Insulin glargine) .... 20 u subcutaneously with dinner meal 14)  Pen Needles 31g X 8 Mm Misc (Insulin pen needle) .... To use as direct with lantus solostar 15)  Onetouch Ultra Test Strp (Glucose blood) .... Marland Kitchenuse as directed 16)  Ciclopirox 8 % Soln (Ciclopirox) .... Use as directed 17)  Promethazine Hcl 25 Mg Supp (Promethazine hcl) .Marland Kitchen.. 1 every 6-8  hourts as needed nausea 18)  Cleocin-t 1 % Gel (Clindamycin phosphate) .... Aplly to face two times a day at afect areas 19)  Doxycycline Hyclate  100 Mg Caps (Doxycycline hyclate) .... One by mouth two times a day for 14 days 20)  Januvia 100 Mg Tabs (Sitagliptin phosphate) .... One by mouth daily per endocrin consult  Hypertension Assessment/Plan:      The patient's hypertensive risk group is category C: Target organ damage and/or diabetes.  His calculated 10 year risk of coronary heart disease is 18 %.  Today's blood pressure is 130/72.  His blood pressure goal is < 130/80.   Patient  Instructions: 1)  Take your antibiotic as prescribed until ALL of it is gone, but stop if you develop a rash or swelling and contact our office as soon as possible. Prescriptions: DOXYCYCLINE HYCLATE 100 MG CAPS (DOXYCYCLINE HYCLATE) one by mouth two times a day for 14 days  #28 x 0   Entered and Authorized by:   Stacie Glaze MD   Signed by:   Stacie Glaze MD on 11/28/2009   Method used:   Electronically to        [facility] [phone]* (retail)       [address]       [place], [place]  [address]       Ph: [phone] or [phone]       Fax: [phone]   RxID:   [id number]

## 2010-08-14 NOTE — Consult Note (Signed)
Summary: [facility]  [facility]   Imported By: Lanelle Bal 03/22/2010 10:51:53  _____________________________________________________________________  External Attachment:    Type:   Image     Comment:   External Document

## 2010-08-14 NOTE — Letter (Signed)
Summary: [facility]  [facility]   Imported By: Maryln Gottron 08/21/2009 09:48:58  _____________________________________________________________________  External Attachment:    Type:   Image     Comment:   External Document

## 2010-08-14 NOTE — Assessment & Plan Note (Signed)
Summary: llq pain/njr   Vital Signs:  Patient profile:   75 year old male Height:      89 inches (226.06 cm) Weight:      181 pounds (82.27 kg) O2 Sat:      98 % on Room air Temp:     98.5 degrees F (36.94 degrees C) oral Pulse rate:   60 / minute BP sitting:   142 / 84  (left arm) Cuff size:   regular  Vitals Entered By: Josph Macho RMA (February 15, 2010 10:11 AM)  O2 Flow:  Room air CC: Pain in groin on Friday, Sunday, Tuesday, and Wednesday-not seemed to help the pain/ CF Is Patient Diabetic? Yes   History of Present Illness: Patient in today for evaluation of pain in his LLQ. He has been struggling with the pain off and on for several days. The first day he noted the pain was last friday, roughly 6 days ago. He describes the pain as sharp and lasting roughly 10 minutes. He had similar episodes, one a day on Sunday, Tuesday and Wedsday then last night the pain lasted longer and kept him up much of the night. He denies any other concerning symptoms. No change in his bowel habits lately, he moves his bowels comfortably on most days. He does not have to strain. Bowels are normal caliber and color. No bloody or tarry stool. No f/c/malaise/vomitting/anorexia/urinary symptosm. He does note some mild nausea when the pain is severe. He denies any obvious palpable hernia or GU lesion, no urinary symptoms such as hesitancy/frequency/urgency/dysuria or hematuria. No testicular lesions. He had a colonoscopy back in 1/07 which showed 2 small polyps and an internal hemorrhoid but had not had any trouble til this point. He is denying and CP/palp/SOB/radicular pain down his legs.  Current Medications (verified): 1)  Actos 45 Mg  Tabs (Pioglitazone Hcl) .... Take 1 Tablet By Mouth Once A Day 2)  Vytorin 10-40 Mg  Tabs (Ezetimibe-Simvastatin) .... Take 1 Tablet By Mouth At Bedtime 3)  Valtrex 1 Gm  Tabs (Valacyclovir Hcl) .... Once Daily 4)  Niacin 500 Mg  Tabs (Niacin) .... Take 1 Tablet By Mouth  Once A Day 5)  Aspirin 325 Mg  Tabs (Aspirin) .... Take 1 Tablet By Mouth Once A Day 6)  Folic Acid 400 Mcg  Tabs (Folic Acid) .... Take 1 Tablet By Mouth Once A Day 7)  L-Lysine Hcl 500 Mg  Tabs (Lysine Hcl) .... Take 1 Tablet By Mouth Once A Day 8)  Vitamin C 500 Mg  Tabs (Ascorbic Acid) .... Once Daily 9)  Potassium Gluconate 550 Mg  Tabs (Potassium Gluconate) .... Once Daily 10)  Fish Oil 1000 Mg  Caps (Omega-3 Fatty Acids) .... Once Daily 11)  Aricept 10 Mg Tabs (Donepezil Hcl) .... One By Mouth Daily 12)  Lotrisone 1-0.05 % Crea (Clotrimazole-Betamethasone) .... Apply To Rash Bid 13)  Lantus Solostar 100 Unit/ml Soln (Insulin Glargine) .... 20 U Subcutaneously With Dinner Meal 14)  Pen Needles 31g X 8 Mm Misc (Insulin Pen Needle) .... To Use As Direct With Lantus Solostar 15)  Onetouch Ultra Test  Strp (Glucose Blood) .... Marland Kitchenuse As Directed 16)  Promethazine Hcl 25 Mg Supp (Promethazine Hcl) .Marland Kitchen.. 1 Every 6-8  Hourts As Needed Nausea 17)  Cleocin-T 1 % Gel (Clindamycin Phosphate) .... Aplly To Face Two Times A Day At Afect Areas 18)  Januvia 100 Mg Tabs (Sitagliptin Phosphate) .... One By Mouth Daily Per Endocrin Consult 19)  Metoclopramide Hcl 5 Mg Tabs (Metoclopramide Hcl) .... One Table Three Times A Day For Nausea.  Allergies (verified): No Known Drug Allergies  Past History:  Past medical history reviewed for relevance to current acute and chronic problems. Social history (including risk factors) reviewed for relevance to current acute and chronic problems.  Past Medical History: Reviewed history from 02/19/2007 and no changes required. Diabetes mellitus, type II Hyperlipidemia HSV  Social History: Reviewed history from 01/21/2008 and no changes required. Never Smoked Retired Alcohol use-yes Drug use-no Regular exercise-yes  Review of Systems      See HPI  Physical Exam  General:  Well-developed,well-nourished,in no acute distress; alert,appropriate and  cooperative throughout examination Head:  Normocephalic and atraumatic without obvious abnormalities. Mouth:  Oral mucosa and oropharynx without lesions or exudates.  MMM Neck:  No deformities, masses, or tenderness noted. Lungs:  Normal respiratory effort, chest expands symmetrically. Lungs are clear to auscultation, no crackles or wheezes. Heart:  Normal rate and regular rhythm. S1 and S2 normal without gallop, murmur, click, rub or other extra sounds. Abdomen:  Bowel sounds positive,abdomen soft and non-tender without masses, organomegaly or hernias noted. No obvious inguinal hernia with palpation b/l Genitalia:  Testes bilaterally descended without nodularity, tenderness or masses. No scrotal masses or lesions. No penis lesions or urethral discharge. Pulses:  R and L posterior tibial pulses are full and equal bilaterally Extremities:  No clubbing, cyanosis, edema, or deformity noted    Cervical Nodes:  No lymphadenopathy noted Inguinal Nodes:  No significant adenopathy Psych:  Cognition and judgment appear intact. Alert and cooperative with normal attention span and concentration. No apparent delusions, illusions, hallucinations   Impression & Recommendations:  Problem # 1:  ABDOMINAL PAIN, LEFT LOWER QUADRANT, HX OF (ICD-V15.89)  Orders: T-Abdomen 2-view (74020TC) maintain adequate hydration and consider a fiber supplement.  No pain at present, if pain returns and no cause is found in xray will need CT abd/pelvis due to severity of pain  Problem # 2:  DIABETES MELLITUS, TYPE II (ICD-250.00)  His updated medication list for this problem includes:    Actos 45 Mg Tabs (Pioglitazone hcl) .Marland Kitchen... Take 1 tablet by mouth once a day    Aspirin 325 Mg Tabs (Aspirin) .Marland Kitchen... Take 1 tablet by mouth once a day    Lantus Solostar 100 Unit/ml Soln (Insulin glargine) .Marland Kitchen... 20 u subcutaneously with dinner meal    Januvia 100 Mg Tabs (Sitagliptin phosphate) ..... One by mouth daily per endocrin  consult No recent spike in blood sugars  Complete Medication List: 1)  Actos 45 Mg Tabs (Pioglitazone hcl) .... Take 1 tablet by mouth once a day 2)  Vytorin 10-40 Mg Tabs (Ezetimibe-simvastatin) .... Take 1 tablet by mouth at bedtime 3)  Valtrex 1 Gm Tabs (Valacyclovir hcl) .... Once daily 4)  Niacin 500 Mg Tabs (Niacin) .... Take 1 tablet by mouth once a day 5)  Aspirin 325 Mg Tabs (Aspirin) .... Take 1 tablet by mouth once a day 6)  Folic Acid 400 Mcg Tabs (Folic acid) .... Take 1 tablet by mouth once a day 7)  L-lysine Hcl 500 Mg Tabs (Lysine hcl) .... Take 1 tablet by mouth once a day 8)  Vitamin C 500 Mg Tabs (Ascorbic acid) .... Once daily 9)  Potassium Gluconate 550 Mg Tabs (Potassium gluconate) .... Once daily 10)  Fish Oil 1000 Mg Caps (Omega-3 fatty acids) .... Once daily 11)  Aricept 10 Mg Tabs (Donepezil hcl) .... One by mouth daily 12)  Lotrisone 1-0.05 % Crea (Clotrimazole-betamethasone) .... Apply to rash bid 13)  Lantus Solostar 100 Unit/ml Soln (Insulin glargine) .... 20 u subcutaneously with dinner meal 14)  Pen Needles 31g X 8 Mm Misc (Insulin pen needle) .... To use as direct with lantus solostar 15)  Onetouch Ultra Test Strp (Glucose blood) .... Marland Kitchenuse as directed 16)  Promethazine Hcl 25 Mg Supp (Promethazine hcl) .Marland Kitchen.. 1 every 6-8  hourts as needed nausea 17)  Cleocin-t 1 % Gel (Clindamycin phosphate) .... Aplly to face two times a day at afect areas 18)  Januvia 100 Mg Tabs (Sitagliptin phosphate) .... One by mouth daily per endocrin consult 19)  Metoclopramide Hcl 5 Mg Tabs (Metoclopramide hcl) .... One table three times a day for nausea. 20)  Tramadol Hcl 50 Mg Tabs (Tramadol hcl) .Marland Kitchen.. 1 tab by mouth three times a day as needed pain 21)  Levsin/sl 0.125 Mg Subl (Hyoscyamine sulfate) .Marland Kitchen.. 1 tab sl as needed abdominal pain, may repeat q 2 hours if pain persists. 22)  Norco 5-325 Mg Tabs (Hydrocodone-acetaminophen) .Marland Kitchen.. 1-2 tabs by mouth q 4-6 hours prn  Patient  Instructions: 1)  Please schedule a follow-up appointment as needed if symptoms worsen or do not improve. 2)  apply ice when pain appears, cover the area in Aspercreme if no relief then may try Tramadol as needed  Prescriptions: TRAMADOL HCL 50 MG TABS (TRAMADOL HCL) 1 tab by mouth three times a day as needed pain  #30 x 1   Entered and Authorized by:   Danise Edge MD   Signed by:   Danise Edge MD on 02/15/2010   Method used:   [facility]        [facility] [phone]* (retail)       [address]       [place], [place]  [address]       Ph: [phone] or [phone]       Fax: [phone]   RxID:   [id number]

## 2010-08-14 NOTE — Progress Notes (Signed)
Summary: call report  Phone Note From Other Clinic   Caller: [facility] [phone] Call For: blyth Summary of Call: Renal cell carcinoma.  Large enhancing mass inferior pole left kidney.  Patient is here & waiting for call back. Initial call taken by: Rudy Jew, RN,  February 16, 2010 3:37 PM  Follow-up for Phone Call        Per MD we are having pt come back to office to talk with him. Follow-up by: Josph Macho RMA,  February 16, 2010 3:46 PM     Appended Document: call report Pt came and dropped off cd for urologist and left before MD had a chance to talk to him. I left a message on pts home phone to return my call and also called pts cell phone [phone]) but its going straight to vm.  Appended Document: Orders Update     Clinical Lists Changes  Problems: Added new problem of MALIGNANT NEOPLASM OF KIDNEY EXCEPT PELVIS (ICD-189.0) Orders: Added new Referral order of Urology Referral (Urology) - Signed

## 2010-08-14 NOTE — Progress Notes (Signed)
Summary: leg pain  Phone Note Call from Patient   Caller: Patient Call For: Stacie Glaze MD Reason for Call: Acute Illness Summary of Call: Pt walks in office asking to be seen by a MD.  Talked to pt at length...Marland KitchenMarland KitchenMarland Kitchenhe started having left thigh and buttock pain x 4- 5 days ago, and it is getting worse daily.  While at the gym today, his leg gave away with him.  Only injury he remembers is a fall while doing yard work, but did not feel this was significant at the time.  Is very concerned about the leg weakness, and unsteadiness. Initial call taken by: Lynann Beaver CMA,  December 22, 2009 12:45 PM  Follow-up for Phone Call        ov given Follow-up by: Willy Eddy, LPN,  December 22, 2009 2:19 PM

## 2010-08-14 NOTE — Letter (Signed)
Summary: [facility]  [facility]   Imported By: Maryln Gottron 04/26/2010 09:18:33  _____________________________________________________________________  External Attachment:    Type:   Image     Comment:   External Document

## 2010-08-14 NOTE — Assessment & Plan Note (Signed)
Summary: 2 month rov/njr   Vital Signs:  Patient profile:   75 year old male Height:      69 inches Weight:      186 pounds BMI:     27.57 Temp:     98.2 degrees F oral Pulse rate:   7 / minute Resp:     14 per minute BP sitting:   110 / 70  (left arm)  Vitals Entered By: Willy Eddy, LPN (August 08, 2009 1:20 PM) CC: roa=-pt states he had low blood sugar sx coming here today and ate a candy bar, Type 2 diabetes mellitus follow-up   Primary Care Provider:  Stacie Glaze MD  CC:  roa=-pt states he had low blood sugar sx coming here today and ate a candy bar and Type 2 diabetes mellitus follow-up.  History of Present Illness: Am blood sugars in the 100 to 120 range  occasionally has episodes of low blood sugars missed meals are the main causes discussion of treatment of low blood glucoses  Type 2 Diabetes Mellitus Follow-Up      This is a 75 year old man who presents for Type 2 diabetes mellitus follow-up.  had an episode of hypoglycemia today.  The patient denies polyuria, polydipsia, blurred vision, self managed hypoglycemia, hypoglycemia requiring help, weight loss, weight gain, and numbness of extremities.  The patient denies the following symptoms: neuropathic pain, chest pain, vomiting, orthostatic symptoms, poor wound healing, intermittent claudication, vision loss, and foot ulcer.  Since the last visit the patient reports good dietary compliance.  The patient has been measuring capillary blood glucose before breakfast.    Preventive Screening-Counseling & Management  Alcohol-Tobacco     Smoking Status: never  Current Medications (verified): 1)  Glyburide-Metformin 2.5-500 Mg  Tabs (Glyburide-Metformin) .... Take Two Tablets Daily 2)  Actos 45 Mg  Tabs (Pioglitazone Hcl) .... Take 1 Tablet By Mouth Once A Day 3)  Vytorin 10-40 Mg  Tabs (Ezetimibe-Simvastatin) .... Take 1 Tablet By Mouth At Bedtime 4)  Valtrex 1 Gm  Tabs (Valacyclovir Hcl) .... Once Daily 5)   Niacin 500 Mg  Tabs (Niacin) .... Take 1 Tablet By Mouth Once A Day 6)  Aspirin 325 Mg  Tabs (Aspirin) .... Take 1 Tablet By Mouth Once A Day 7)  Folic Acid 400 Mcg  Tabs (Folic Acid) .... Take 1 Tablet By Mouth Once A Day 8)  L-Lysine Hcl 500 Mg  Tabs (Lysine Hcl) .... Take 1 Tablet By Mouth Once A Day 9)  Vitamin C 500 Mg  Tabs (Ascorbic Acid) .... Once Daily 10)  Potassium Gluconate 550 Mg  Tabs (Potassium Gluconate) .... Once Daily 11)  Fish Oil 1000 Mg  Caps (Omega-3 Fatty Acids) .... Once Daily 12)  Aricept 10 Mg Tabs (Donepezil Hcl) .... One By Mouth Daily 13)  Lotrisone 1-0.05 % Crea (Clotrimazole-Betamethasone) .... Apply To Rash Bid 14)  Lantus Solostar 100 Unit/ml Soln (Insulin Glargine) .... 20 U Subcutaneously With Dinner Meal 15)  Pen Needles 31g X 8 Mm Misc (Insulin Pen Needle) .... To Use As Direct With Lantus Solostar 16)  Onetouch Ultra Test  Strp (Glucose Blood) .... Marland Kitchenuse As Directed 17)  Ciclopirox 8 % Soln (Ciclopirox) .... Use As Directed  Allergies (verified): No Known Drug Allergies  Past History:  Family History: Last updated: 01/21/2008 father CVA at age 61 mother died at 44   alzheimers sister breast cancer  Social History: Last updated: 01/21/2008 Never Smoked Retired Alcohol use-yes Drug use-no  Regular exercise-yes  Risk Factors: Exercise: yes (01/21/2008)  Risk Factors: Smoking Status: never (08/08/2009)  Past medical, surgical, family and social histories (including risk factors) reviewed, and no changes noted (except as noted below).  Past Medical History: Reviewed history from 02/19/2007 and no changes required. Diabetes mellitus, type II Hyperlipidemia HSV  Past Surgical History: Reviewed history from 02/13/2007 and no changes required. Colonoscopy-08/01/2005  Family History: Reviewed history from 01/21/2008 and no changes required. father CVA at age 58 mother died at 54   alzheimers sister breast cancer  Social  History: Reviewed history from 01/21/2008 and no changes required. Never Smoked Retired Alcohol use-yes Drug use-no Regular exercise-yes  Review of Systems  The patient denies anorexia, fever, weight loss, weight gain, vision loss, decreased hearing, hoarseness, chest pain, syncope, dyspnea on exertion, peripheral edema, prolonged cough, headaches, hemoptysis, abdominal pain, melena, hematochezia, severe indigestion/heartburn, hematuria, incontinence, genital sores, muscle weakness, suspicious skin lesions, transient blindness, difficulty walking, depression, unusual weight change, abnormal bleeding, enlarged lymph nodes, angioedema, and breast masses.    Physical Exam  General:  Well-developed,well-nourished,in no acute distress; alert,appropriate and cooperative throughout examination Head:  normocephalic and atraumatic.   Eyes:  pupils equal, pupils round, and pupils reactive to light.   Ears:  R ear normal and L ear normal.   Nose:  no external deformity and no nasal discharge.   Mouth:  no posterior lymphoid hypertrophy and no postnasal drip.   Neck:  No deformities, masses, or tenderness noted. Lungs:  Normal respiratory effort, chest expands symmetrically. Lungs are clear to auscultation, no crackles or wheezes. Heart:  Normal rate and regular rhythm. S1 and S2 normal without gallop, murmur, click, rub or other extra sounds. Abdomen:  Bowel sounds positive,abdomen soft and non-tender without masses, organomegaly or hernias noted. Msk:  No deformity or scoliosis noted of thoracic or lumbar spine.   Extremities:  No clubbing, cyanosis, edema, or deformity noted with normal full range of motion of all joints.  fungal nail and ingrow nail on left foot Neurologic:  alert & oriented X3 and confused.     Impression & Recommendations:  Problem # 1:  DIABETES MELLITUS, TYPE II (ICD-250.00)  meals need military precision to meal His updated medication list for this problem includes:     Glyburide-metformin 2.5-500 Mg Tabs (Glyburide-metformin) .Marland Kitchen... Take two tablets daily    Actos 45 Mg Tabs (Pioglitazone hcl) .Marland Kitchen... Take 1 tablet by mouth once a day    Aspirin 325 Mg Tabs (Aspirin) .Marland Kitchen... Take 1 tablet by mouth once a day    Lantus Solostar 100 Unit/ml Soln (Insulin glargine) .Marland Kitchen... 20 u subcutaneously with dinner meal  Orders: Diabetic Clinic Referral (Diabetic) Venipuncture (40981) TLB-A1C / Hgb A1C (Glycohemoglobin) (83036-A1C)  Labs Reviewed: Creat: 1.2 (06/06/2009)     Last Eye Exam: normal (03/01/2009) Reviewed HgBA1c results: 7.3 (06/06/2009)  13.7 (02/28/2009)  Problem # 2:  HYPOGLYCEMIA, UNSPECIFIED (ICD-251.2) witnessed due to missed meal today  Complete Medication List: 1)  Glyburide-metformin 2.5-500 Mg Tabs (Glyburide-metformin) .... Take two tablets daily 2)  Actos 45 Mg Tabs (Pioglitazone hcl) .... Take 1 tablet by mouth once a day 3)  Vytorin 10-40 Mg Tabs (Ezetimibe-simvastatin) .... Take 1 tablet by mouth at bedtime 4)  Valtrex 1 Gm Tabs (Valacyclovir hcl) .... Once daily 5)  Niacin 500 Mg Tabs (Niacin) .... Take 1 tablet by mouth once a day 6)  Aspirin 325 Mg Tabs (Aspirin) .... Take 1 tablet by mouth once a day 7)  Folic Acid 400  Mcg Tabs (Folic acid) .... Take 1 tablet by mouth once a day 8)  L-lysine Hcl 500 Mg Tabs (Lysine hcl) .... Take 1 tablet by mouth once a day 9)  Vitamin C 500 Mg Tabs (Ascorbic acid) .... Once daily 10)  Potassium Gluconate 550 Mg Tabs (Potassium gluconate) .... Once daily 11)  Fish Oil 1000 Mg Caps (Omega-3 fatty acids) .... Once daily 12)  Aricept 10 Mg Tabs (Donepezil hcl) .... One by mouth daily 13)  Lotrisone 1-0.05 % Crea (Clotrimazole-betamethasone) .... Apply to rash bid 14)  Lantus Solostar 100 Unit/ml Soln (Insulin glargine) .... 20 u subcutaneously with dinner meal 15)  Pen Needles 31g X 8 Mm Misc (Insulin pen needle) .... To use as direct with lantus solostar 16)  Onetouch Ultra Test Strp (Glucose blood)  .... Marland Kitchenuse as directed 17)  Ciclopirox 8 % Soln (Ciclopirox) .... Use as directed  Patient Instructions: 1)  Meals need to be "on time" and small protien snaks need to occur before activity ( banana and peanutbutter is a good example) 2)  and a small snak befrore bed ( string cheeze, almonds,  peanut butter on an apple slices 3)  your memory and vision are related to low blood sugars 4)   you must go to the teaching classes 5)  Please schedule a follow-up appointment in 1 month.

## 2010-08-14 NOTE — Assessment & Plan Note (Signed)
Summary: thigh pain/bmw   Vital Signs:  Patient profile:   75 year old male Height:      89 inches Weight:      178 pounds BMI:     15.86 Temp:     98.2 degrees F oral Pulse rate:   72 / minute Resp:     14 per minute BP sitting:   136 / 80  (left arm)  Vitals Entered By: Willy Eddy, LPN (January 23, 2010 12:41 PM) CC: roa Is Patient Diabetic? Yes Did you bring your meter with you today? Yes   Primary Care Provider:  Stacie Glaze MD  CC:  roa.  History of Present Illness: The pt has noted dark urine without buring he had a UA with trace blood but no nitrates or leukocytes the urine was concentrated the pt has stopped playign basketball due to pain and numbness in his  left leg the numbness stops at the knee he can run straight but cannot move side to side without pain there is no pain the pt states that he falls with a jump shot  episodic nausea after eating with possible gastroparesis  Preventive Screening-Counseling & Management  Alcohol-Tobacco     Smoking Status: never  Current Problems (verified): 1)  [name]  (ICD-041.12) 2)  Acne Rosacea  (ICD-695.3) 3)  Hypoglycemia, Unspecified  (ICD-251.2) 4)  Memory Loss  (ICD-780.93) 5)  Physical Examination  (ICD-V70.0) 6)  Memory Loss  (ICD-780.93) 7)  Elevated Prostate Specific Antigen  (ICD-790.93) 8)  Uns Advrs Eff Uns Rx Medicinal&biological Sbstnc  (ICD-995.20) 9)  Uns Advrs Eff Uns Rx Medicinal&biological Sbstnc  (ICD-995.20) 10)  Ingrown Toenail  (ICD-703.0) 11)  Hyperlipidemia  (ICD-272.4) 12)  Diabetes Mellitus, Type II  (ICD-250.00) 13)  Hsv  (ICD-054.9)  Current Medications (verified): 1)  Actos 45 Mg  Tabs (Pioglitazone Hcl) .... Take 1 Tablet By Mouth Once A Day 2)  Vytorin 10-40 Mg  Tabs (Ezetimibe-Simvastatin) .... Take 1 Tablet By Mouth At Bedtime 3)  Valtrex 1 Gm  Tabs (Valacyclovir Hcl) .... Once Daily 4)  Niacin 500 Mg  Tabs (Niacin) .... Take 1 Tablet By Mouth Once A Day 5)  Aspirin 325  Mg  Tabs (Aspirin) .... Take 1 Tablet By Mouth Once A Day 6)  Folic Acid 400 Mcg  Tabs (Folic Acid) .... Take 1 Tablet By Mouth Once A Day 7)  L-Lysine Hcl 500 Mg  Tabs (Lysine Hcl) .... Take 1 Tablet By Mouth Once A Day 8)  Vitamin C 500 Mg  Tabs (Ascorbic Acid) .... Once Daily 9)  Potassium Gluconate 550 Mg  Tabs (Potassium Gluconate) .... Once Daily 10)  Fish Oil 1000 Mg  Caps (Omega-3 Fatty Acids) .... Once Daily 11)  Aricept 10 Mg Tabs (Donepezil Hcl) .... One By Mouth Daily 12)  Lotrisone 1-0.05 % Crea (Clotrimazole-Betamethasone) .... Apply To Rash Bid 13)  Lantus Solostar 100 Unit/ml Soln (Insulin Glargine) .... 20 U Subcutaneously With Dinner Meal 14)  Pen Needles 31g X 8 Mm Misc (Insulin Pen Needle) .... To Use As Direct With Lantus Solostar 15)  Onetouch Ultra Test  Strp (Glucose Blood) .... Marland Kitchenuse As Directed 16)  Promethazine Hcl 25 Mg Supp (Promethazine Hcl) .Marland Kitchen.. 1 Every 6-8  Hourts As Needed Nausea 17)  Cleocin-T 1 % Gel (Clindamycin Phosphate) .... Aplly To Face Two Times A Day At Afect Areas 18)  Januvia 100 Mg Tabs (Sitagliptin Phosphate) .... One By Mouth Daily Per Endocrin Consult  Allergies (verified): No  Known Drug Allergies  Past History:  Family History: Last updated: 01/21/2008 father CVA at age 38 mother died at 48   alzheimers sister breast cancer  Social History: Last updated: 01/21/2008 Never Smoked Retired Alcohol use-yes Drug use-no Regular exercise-yes  Risk Factors: Exercise: yes (01/21/2008)  Risk Factors: Smoking Status: never (01/23/2010)  Past medical, surgical, family and social histories (including risk factors) reviewed, and no changes noted (except as noted below).  Past Medical History: Reviewed history from 02/19/2007 and no changes required. Diabetes mellitus, type II Hyperlipidemia HSV  Past Surgical History: Reviewed history from 02/13/2007 and no changes required. Colonoscopy-08/01/2005  Family History: Reviewed history  from 01/21/2008 and no changes required. father CVA at age 5 mother died at 56   alzheimers sister breast cancer  Social History: Reviewed history from 01/21/2008 and no changes required. Never Smoked Retired Alcohol use-yes Drug use-no Regular exercise-yes  Review of Systems  The patient denies anorexia, fever, weight loss, weight gain, vision loss, decreased hearing, hoarseness, chest pain, syncope, dyspnea on exertion, peripheral edema, prolonged cough, headaches, hemoptysis, abdominal pain, melena, hematochezia, severe indigestion/heartburn, hematuria, incontinence, genital sores, muscle weakness, suspicious skin lesions, transient blindness, difficulty walking, depression, unusual weight change, abnormal bleeding, enlarged lymph nodes, angioedema, and breast masses.    Physical Exam  General:  Well-developed,well-nourished,in no acute distress; alert,appropriate and cooperative throughout examination Head:  normocephalic and atraumatic.   Eyes:  pupils equal, pupils round, and pupils reactive to light.   Ears:  R ear normal and L ear normal.   Nose:  no external deformity and no nasal discharge.   Mouth:  no posterior lymphoid hypertrophy and no postnasal drip.   Neck:  supple and full ROM.   Lungs:  normal respiratory effort and no intercostal retractions.   Heart:  normal rate and regular rhythm.   Abdomen:  Bowel sounds positive,abdomen soft and non-tender without masses, organomegaly or hernias noted. Msk:  SI joint tenderness and trigger point tenderness.    Diabetes Management Exam:    Foot Exam (with socks and/or shoes not present):       Sensory-Pinprick/Light touch:          Left medial foot (L-4): diminished          Left dorsal foot (L-5): diminished          Left lateral foot (S-1): diminished          Right medial foot (L-4): diminished          Right dorsal foot (L-5): diminished          Right lateral foot (S-1): diminished   Impression &  Recommendations:  Problem # 1:  LUMBAR RADICULOPATHY, LEFT (ICD-724.4)  His updated medication list for this problem includes:    Aspirin 325 Mg Tabs (Aspirin) .Marland Kitchen... Take 1 tablet by mouth once a day  Discussed use of moist heat or ice, modified activities, medications, and stretching/strengthening exercises. Back care instructions given. To be seen in 2 weeks if no improvement; sooner if worsening of symptoms.   Orders: T-Lumbar Spine Complete, 5 Views (71110TC)  Problem # 2:  DARK URINE (ICD-788.69) ua showed trace blood Orders: TLB-CBC Platelet - w/Differential (85025-CBCD) TLB-Hepatic/Liver Function Pnl (80076-HEPATIC)  Problem # 3:  MEMORY LOSS (ICD-780.93) progressive  Problem # 4:  DIABETES MELLITUS, TYPE II (ICD-250.00) moniter labs, suspect he has gastroparesis His updated medication list for this problem includes:    Actos 45 Mg Tabs (Pioglitazone hcl) .Marland Kitchen... Take 1 tablet by mouth once  a day    Aspirin 325 Mg Tabs (Aspirin) .Marland Kitchen... Take 1 tablet by mouth once a day    Lantus Solostar 100 Unit/ml Soln (Insulin glargine) .Marland Kitchen... 20 u subcutaneously with dinner meal    Januvia 100 Mg Tabs (Sitagliptin phosphate) ..... One by mouth daily per endocrin consult  Orders: TLB-BMP (Basic Metabolic Panel-BMET) (80048-METABOL) TLB-A1C / Hgb A1C (Glycohemoglobin) (83036-A1C)  Labs Reviewed: Creat: 1.2 (06/06/2009)     Last Eye Exam: normal (03/01/2009) Reviewed HgBA1c results: 7.2 (08/08/2009)  7.3 (06/06/2009)  Problem # 5:  GASTROPARESIS (ICD-536.3) added trial of reglan  Complete Medication List: 1)  Actos 45 Mg Tabs (Pioglitazone hcl) .... Take 1 tablet by mouth once a day 2)  Vytorin 10-40 Mg Tabs (Ezetimibe-simvastatin) .... Take 1 tablet by mouth at bedtime 3)  Valtrex 1 Gm Tabs (Valacyclovir hcl) .... Once daily 4)  Niacin 500 Mg Tabs (Niacin) .... Take 1 tablet by mouth once a day 5)  Aspirin 325 Mg Tabs (Aspirin) .... Take 1 tablet by mouth once a day 6)  Folic  Acid 400 Mcg Tabs (Folic acid) .... Take 1 tablet by mouth once a day 7)  L-lysine Hcl 500 Mg Tabs (Lysine hcl) .... Take 1 tablet by mouth once a day 8)  Vitamin C 500 Mg Tabs (Ascorbic acid) .... Once daily 9)  Potassium Gluconate 550 Mg Tabs (Potassium gluconate) .... Once daily 10)  Fish Oil 1000 Mg Caps (Omega-3 fatty acids) .... Once daily 11)  Aricept 10 Mg Tabs (Donepezil hcl) .... One by mouth daily 12)  Lotrisone 1-0.05 % Crea (Clotrimazole-betamethasone) .... Apply to rash bid 13)  Lantus Solostar 100 Unit/ml Soln (Insulin glargine) .... 20 u subcutaneously with dinner meal 14)  Pen Needles 31g X 8 Mm Misc (Insulin pen needle) .... To use as direct with lantus solostar 15)  Onetouch Ultra Test Strp (Glucose blood) .... Marland Kitchenuse as directed 16)  Promethazine Hcl 25 Mg Supp (Promethazine hcl) .Marland Kitchen.. 1 every 6-8  hourts as needed nausea 17)  Cleocin-t 1 % Gel (Clindamycin phosphate) .... Aplly to face two times a day at afect areas 18)  Januvia 100 Mg Tabs (Sitagliptin phosphate) .... One by mouth daily per endocrin consult 19)  Metoclopramide Hcl 5 Mg Tabs (Metoclopramide hcl) .... One table three times a day for nausea.  Patient Instructions: 1)  Please schedule a follow-up appointment in 2 months. Prescriptions: METOCLOPRAMIDE HCL 5 MG TABS (METOCLOPRAMIDE HCL) one table three times a day for nausea.  #90 x 6   Entered and Authorized by:   Bryan Glaze MD   Signed by:   Bryan Glaze MD on 01/23/2010   Method used:   Electronically to        [facility] [phone]* (retail)       [address]       [place], [place]  [address]       Ph: [phone] or [phone]       Fax: [phone]   RxID:   [id number]   Laboratory Results   Urine Tests    Routine Urinalysis   Color: yellow Appearance: Clear Glucose: 3+   (Normal Range: Negative) Bilirubin: negative   (Normal Range: Negative) Ketone: negative   (Normal Range: Negative) Spec. Gravity: >=1.030   (Normal Range:  1.003-1.035) Blood: 2+   (Normal Range: Negative) pH: 5.5   (Normal Range: 5.0-8.0) Protein: trace   (Normal Range: Negative) Urobilinogen: 0.2   (Normal Range: 0-1) Nitrite: negative   (Normal  Range: Negative) Leukocyte Esterace: negative   (Normal Range: Negative)    Comments: Rita Ohara  January 23, 2010 1:34 PM     Appended Document: Orders Update     Clinical Lists Changes  Orders: Added new Service order of Venipuncture [phone]) - Signed

## 2010-08-14 NOTE — Progress Notes (Signed)
Summary: Something for his stomach  Phone Note Call from Patient Call back at Home Phone [phone]   Caller: Patient Call For: Stacie Glaze MD Summary of Call: Patient would like you to call in something for his stomach. He has been taking Pepto Bismal all yesterday. [facility]  Initial call taken by: Harold Barban,  August 21, 2009 8:52 AM  Follow-up for Phone Call        [facility] Debby [facility] CMA  August 21, 2009 12:25 PM   Additional Follow-up for Phone Call Additional follow up Details #1::        Pt. is asking Dr. Lovell Sheehan to call RX in for nausea that he can keep at home.  He had a sudden spell of vomiting Sunday, and did not have anything to take at home.  [facility] Additional Follow-up by: Debby Meadows CMA,  August 21, 2009 3:25 PM    Additional Follow-up for Phone Call Additional follow up Details #2::    phenergan 25 supp per dr jenkins- sent to ph [facility] and pt informed Follow-up by: Bonnye M Williams, LPN,  August 21, 2009 5:33 PM  New/Updated Medications: PROMETHAZINE HCL 25 MG SUPP (PROMETHAZINE HCL) 1 every 6-8  hourts as needed nausea Prescriptions: PROMETHAZINE HCL 25 MG SUPP (PROMETHAZINE HCL) 1 every 6-8  hourts as needed nausea  #12 x 1   Entered by:   Bonnye M Williams, LPN   Authorized by:   Romin E Jenkins MD   Signed by:   Bonnye M Williams, LPN on 08/21/2009   Method used:   [facility]        [facility]  [address] [id number]       [address]       [place], [place]  [address]       Ph: [phone] or [phone]       Fax: [phone]   RxID:   [id number]

## 2010-08-14 NOTE — Consult Note (Signed)
Summary: [facility]   Imported By: Maryln Gottron 11/20/2009 14:52:50  _____________________________________________________________________  External Attachment:    Type:   Image     Comment:   External Document

## 2010-08-14 NOTE — Assessment & Plan Note (Signed)
Summary: fu per doc/njr   Vital Signs:  Patient profile:   75 year old male Height:      89 inches (226.06 cm) Weight:      180.31 pounds (81.96 kg) O2 Sat:      97 % on Room air Temp:     98.2 degrees F (36.78 degrees C) oral Pulse rate:   64 / minute BP sitting:   124 / 78  (left arm) Cuff size:   regular  Vitals Entered By: Josph Macho RMA (February 19, 2010 9:59 AM)  O2 Flow:  Room air CC: Follow-up visit/ CF Is Patient Diabetic? Yes   History of Present Illness: Patient in today with a friend to review his CT scan results. He had a good weekend and is not in pain presently. The by mouth contrast he had to drink cleaned out his bowels and he has not had pain over the weekend. His bowels moved well today, no bloody/tarry or changed stools. No change in appetite. He reports his blood sugar was 151 this am fasting. No CP/palp/SOB/GI or GU c/o. He has a friend with him to help him understand what is going on. He says he feels calm and he slept well over the weekend, he never picked up any pain meds but does not know what he did with his Norco rx  Current Medications (verified): 1)  Actos 45 Mg  Tabs (Pioglitazone Hcl) .... Take 1 Tablet By Mouth Once A Day 2)  Vytorin 10-40 Mg  Tabs (Ezetimibe-Simvastatin) .... Take 1 Tablet By Mouth At Bedtime 3)  Valtrex 1 Gm  Tabs (Valacyclovir Hcl) .... Once Daily 4)  Niacin 500 Mg  Tabs (Niacin) .... Take 1 Tablet By Mouth Once A Day 5)  Aspirin 325 Mg  Tabs (Aspirin) .... Take 1 Tablet By Mouth Once A Day 6)  Folic Acid 400 Mcg  Tabs (Folic Acid) .... Take 1 Tablet By Mouth Once A Day 7)  L-Lysine Hcl 500 Mg  Tabs (Lysine Hcl) .... Take 1 Tablet By Mouth Once A Day 8)  Vitamin C 500 Mg  Tabs (Ascorbic Acid) .... Once Daily 9)  Potassium Gluconate 550 Mg  Tabs (Potassium Gluconate) .... Once Daily 10)  Fish Oil 1000 Mg  Caps (Omega-3 Fatty Acids) .... Once Daily 11)  Aricept 10 Mg Tabs (Donepezil Hcl) .... One By Mouth Daily 12)  Lotrisone  1-0.05 % Crea (Clotrimazole-Betamethasone) .... Apply To Rash Bid 13)  Lantus Solostar 100 Unit/ml Soln (Insulin Glargine) .... 20 U Subcutaneously With Dinner Meal 14)  Pen Needles 31g X 8 Mm Misc (Insulin Pen Needle) .... To Use As Direct With Lantus Solostar 15)  Onetouch Ultra Test  Strp (Glucose Blood) .... Marland Kitchenuse As Directed 16)  Promethazine Hcl 25 Mg Supp (Promethazine Hcl) .Marland Kitchen.. 1 Every 6-8  Hourts As Needed Nausea 17)  Cleocin-T 1 % Gel (Clindamycin Phosphate) .... Aplly To Face Two Times A Day At Afect Areas 18)  Januvia 100 Mg Tabs (Sitagliptin Phosphate) .... One By Mouth Daily Per Endocrin Consult 19)  Metoclopramide Hcl 5 Mg Tabs (Metoclopramide Hcl) .... One Table Three Times A Day For Nausea. 20)  Tramadol Hcl 50 Mg Tabs (Tramadol Hcl) .Marland Kitchen.. 1 Tab By Mouth Three Times A Day As Needed Pain 21)  Levsin/sl 0.125 Mg Subl (Hyoscyamine Sulfate) .Marland Kitchen.. 1 Tab Sl As Needed Abdominal Pain, May Repeat Q 2 Hours If Pain Persists. 22)  Norco 5-325 Mg Tabs (Hydrocodone-Acetaminophen) .Marland Kitchen.. 1-2 Tabs By Mouth Q 4-6  Hours Prn  Allergies (verified): No Known Drug Allergies  Past History:  Past medical history reviewed for relevance to current acute and chronic problems. Social history (including risk factors) reviewed for relevance to current acute and chronic problems.  Past Medical History: Reviewed history from 02/19/2007 and no changes required. Diabetes mellitus, type II Hyperlipidemia HSV  Social History: Reviewed history from 01/21/2008 and no changes required. Never Smoked Retired Alcohol use-yes Drug use-no Regular exercise-yes  Review of Systems      See HPI  Physical Exam  General:  Well-developed,well-nourished,in no acute distress; alert,appropriate and cooperative throughout examination Head:  Normocephalic and atraumatic without obvious abnormalities. No apparent alopecia or balding. Mouth:  Oral mucosa and oropharynx without lesions or exudates.  Teeth in good  repair. Neck:  No deformities, masses, or tenderness noted. Lungs:  Normal respiratory effort, chest expands symmetrically. Lungs are clear to auscultation, no crackles or wheezes. Heart:  Normal rate and regular rhythm. S1 and S2 normal without gallop, murmur, click, rub or other extra sounds. Abdomen:  Bowel sounds positive,abdomen soft and non-tender without masses, organomegaly or hernias noted. Extremities:  No clubbing, cyanosis, edema, or deformity noted with normal full range of motion of all joints.   Cervical Nodes:  No lymphadenopathy noted Psych:  Cognition and judgment appear intact. Alert and cooperative with normal attention span and concentration. 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 Patient has already been set up with Urology and agrees to notify his close family of his situation  Problem # 2:  CONSTIPATION (ICD-564.00) Mild but enforced how carefully we have to watch it to keep him out of pain. Recommend Pericolace two caps daily, 64 oz clear fluids, probiotics and fiber a fiber supplement. If no BM on day 2 may take MOM as needed, use Hyoscyamine and/or Norco as needed for pain  Problem # 3:  DIABETES MELLITUS, TYPE II (ICD-250.00)  His updated medication list for this problem includes:    Actos 45 Mg Tabs (Pioglitazone hcl) .Marland Kitchen... Take 1 tablet by mouth once a day    Aspirin 325 Mg Tabs (Aspirin) .Marland Kitchen... Take 1 tablet by mouth once a day    Lantus Solostar 100 Unit/ml Soln (Insulin glargine) .Marland Kitchen... 20 u subcutaneously with dinner meal    Januvia 100 Mg Tabs (Sitagliptin phosphate) ..... One by mouth daily per endocrin consult Increased Lantus to 22 units subcutaneously daily to achieve tighter sugar control prior to surgery. Avoid simple carbs  Complete Medication  List: 1)  Actos 45 Mg Tabs (Pioglitazone hcl) .... Take 1 tablet by mouth once a day 2)  Vytorin 10-40 Mg Tabs (Ezetimibe-simvastatin) .... Take 1 tablet by mouth at bedtime 3)  Valtrex 1 Gm Tabs (Valacyclovir hcl) .... Once daily 4)  Niacin 500 Mg Tabs (Niacin) .... Take 1 tablet by mouth once a day 5)  Aspirin 325 Mg Tabs (Aspirin) .... Take 1 tablet by mouth once a day 6)  Folic Acid 400 Mcg Tabs (Folic acid) .... Take 1 tablet by mouth once a day 7)  L-lysine Hcl 500 Mg Tabs (Lysine hcl) .... Take 1 tablet by mouth once a day 8)  Vitamin C 500 Mg Tabs (Ascorbic acid) .... Once daily 9)  Potassium Gluconate 550 Mg Tabs (Potassium gluconate) .... Once daily 10)  Fish Oil 1000 Mg Caps (  Omega-3 fatty acids) .... Once daily 11)  Aricept 10 Mg Tabs (Donepezil hcl) .... One by mouth daily 12)  Lotrisone 1-0.05 % Crea (Clotrimazole-betamethasone) .... Apply to rash bid 13)  Lantus Solostar 100 Unit/ml Soln (Insulin glargine) .... 20 u subcutaneously with dinner meal 14)  Pen Needles 31g X 8 Mm Misc (Insulin pen needle) .... To use as direct with lantus solostar 15)  Onetouch Ultra Test Strp (Glucose blood) .... Marland Kitchenuse as directed 16)  Promethazine Hcl 25 Mg Supp (Promethazine hcl) .Marland Kitchen.. 1 every 6-8  hourts as needed nausea 17)  Cleocin-t 1 % Gel (Clindamycin phosphate) .... Aplly to face two times a day at afect areas 18)  Januvia 100 Mg Tabs (Sitagliptin phosphate) .... One by mouth daily per endocrin consult 19)  Metoclopramide Hcl 5 Mg Tabs (Metoclopramide hcl) .... One table three times a day for nausea. 20)  Tramadol Hcl 50 Mg Tabs (Tramadol hcl) .Marland Kitchen.. 1 tab by mouth three times a day as needed pain 21)  Levsin/sl 0.125 Mg Subl (Hyoscyamine sulfate) .Marland Kitchen.. 1 tab sl as needed abdominal pain, may repeat q 2 hours if pain persists. 22)  Norco 5-325 Mg Tabs (Hydrocodone-acetaminophen) .Marland Kitchen.. 1-2 tabs by mouth q 4-6 hours prn  Patient Instructions: 1)  Please schedule a follow-up appointment in 1 month.   2)  For constipation when Colace buy Pericolace  take 2 daily 3)  If constipation worsens increase fiber pills from 2 a day to 2 twice a day, May take Milk of Magnesium 2 tbls by mouth daily if no BM in 2 days 4)  For Diabetes increase Lantus to 22 units Subcutaneously daily and monitor blood sugars two times a day and as needed, report any concerning numbers. 5)  For abdominal pain you have Levsin,  generic name is Hyoscyamine to take it is at the pharmacy waiting for you if you need it. 6)  If abdominal pain gets worse you were given a paper prescription for Norco 5/325, generic name is Hydrocodone/APAP, if you are unable to find it if you need it just call. 7)  Contact family and let them know if you are willing so they know you might me havning surgery

## 2010-08-14 NOTE — Letter (Signed)
Summary: [facility]  [facility]   Imported By: Lennie Odor 01/26/2010 14:38:40  _____________________________________________________________________  External Attachment:    Type:   Image     Comment:   External Document

## 2010-08-14 NOTE — Miscellaneous (Signed)
Summary: Initial Summary for PT Services/[facility]   Initial Summary for PT Services/[facility]   Imported By: Maryln Gottron 02/02/2010 13:42:04  _____________________________________________________________________  External Attachment:    Type:   Image     Comment:   External Document

## 2010-08-14 NOTE — Assessment & Plan Note (Signed)
Summary: PT NOT FEELING ANY BETTER/RCD   Vital Signs:  Patient profile:   75 year old male Height:      89 inches (226.06 cm) Weight:      181 pounds (82.27 kg) O2 Sat:      98 % on Room air Temp:     98.5 degrees F (36.94 degrees C) oral Pulse rate:   64 / minute BP sitting:   122 / 80  (left arm) Cuff size:   regular  Vitals Entered By: Josph Macho RMA (February 16, 2010 9:33 AM)  O2 Flow:  Room air CC: pt still in pain/ didn't sleep well last night/ CF Is Patient Diabetic? Yes   History of Present Illness: Patient is back in today for revaluation of LLQ abdominal pain. He was unable to sleep last night because the pain was severe. It was in LLQ and persistent, it would wax and wane but did not cease. It was associated with some nausea and he had difficulty finding a comfortable position. He did have a smaller than usual BM this morning, it was brown and formed. No bloody or tarry component. He denies any recent weight loss or anorexia. No fevers/chills/malaise and only feels tired because he has been unable to sleep the past several nights. No CP/palp/SOB. C/O small/pruritic lesions in epigastrium for a couple days. No pain or trauma associated  Anticoagulation Management History:      Positive risk factors for bleeding include an age of 24 years or older and presence of serious comorbidities.  Negative risk factors for bleeding include no history of CVA/TIA.  The bleeding index is 'intermediate risk'.  Positive CHADS2 values include History of HTN and History of Diabetes.  Negative CHADS2 values include Age > 76 years old and Prior Stroke/CVA/TIA.    Current Medications (verified): 1)  Actos 45 Mg  Tabs (Pioglitazone Hcl) .... Take 1 Tablet By Mouth Once A Day 2)  Vytorin 10-40 Mg  Tabs (Ezetimibe-Simvastatin) .... Take 1 Tablet By Mouth At Bedtime 3)  Valtrex 1 Gm  Tabs (Valacyclovir Hcl) .... Once Daily 4)  Niacin 500 Mg  Tabs (Niacin) .... Take 1 Tablet By Mouth Once A  Day 5)  Aspirin 325 Mg  Tabs (Aspirin) .... Take 1 Tablet By Mouth Once A Day 6)  Folic Acid 400 Mcg  Tabs (Folic Acid) .... Take 1 Tablet By Mouth Once A Day 7)  L-Lysine Hcl 500 Mg  Tabs (Lysine Hcl) .... Take 1 Tablet By Mouth Once A Day 8)  Vitamin C 500 Mg  Tabs (Ascorbic Acid) .... Once Daily 9)  Potassium Gluconate 550 Mg  Tabs (Potassium Gluconate) .... Once Daily 10)  Fish Oil 1000 Mg  Caps (Omega-3 Fatty Acids) .... Once Daily 11)  Aricept 10 Mg Tabs (Donepezil Hcl) .... One By Mouth Daily 12)  Lotrisone 1-0.05 % Crea (Clotrimazole-Betamethasone) .... Apply To Rash Bid 13)  Lantus Solostar 100 Unit/ml Soln (Insulin Glargine) .... 20 U Subcutaneously With Dinner Meal 14)  Pen Needles 31g X 8 Mm Misc (Insulin Pen Needle) .... To Use As Direct With Lantus Solostar 15)  Onetouch Ultra Test  Strp (Glucose Blood) .... Marland Kitchenuse As Directed 16)  Promethazine Hcl 25 Mg Supp (Promethazine Hcl) .Marland Kitchen.. 1 Every 6-8  Hourts As Needed Nausea 17)  Cleocin-T 1 % Gel (Clindamycin Phosphate) .... Aplly To Face Two Times A Day At Afect Areas 18)  Januvia 100 Mg Tabs (Sitagliptin Phosphate) .... One By Mouth Daily Per Endocrin Consult  19)  Metoclopramide Hcl 5 Mg Tabs (Metoclopramide Hcl) .... One Table Three Times A Day For Nausea. 20)  Tramadol Hcl 50 Mg Tabs (Tramadol Hcl) .Marland Kitchen.. 1 Tab By Mouth Three Times A Day As Needed Pain  Allergies (verified): No Known Drug Allergies  Past History:  Past medical history reviewed for relevance to current acute and chronic problems. Social history (including risk factors) reviewed for relevance to current acute and chronic problems.  Past Medical History: Reviewed history from 02/19/2007 and no changes required. Diabetes mellitus, type II Hyperlipidemia HSV  Social History: Reviewed history from 01/21/2008 and no changes required. Never Smoked Retired Alcohol use-yes Drug use-no Regular exercise-yes  Review of Systems      See HPI  Physical  Exam  General:  Well-developed,well-nourished,in no acute distress; alert,appropriate and cooperative throughout examination Head:  Normocephalic and atraumatic without obvious abnormalities. Lungs:  Normal respiratory effort, chest expands symmetrically. Lungs are clear to auscultation, no crackles or wheezes. Heart:  Normal rate and regular rhythm. S1 and S2 normal without gallop, murmur, click, rub or other extra sounds. Abdomen:  Bowel sounds positive,abdomen soft and non-tender without masses, organomegaly or hernias noted. No inguinal hernia palpable b/l  Extremities:  No clubbing, cyanosis, edema, or deformity noted   Skin:  small scabbed lesions scattered in epigastrium, no surrounding fluctuance or erythema Psych:  Cognition and judgment appear intact. Alert and cooperative with normal attention span and concentration. No apparent delusions, illusions, hallucinations   Impression & Recommendations:  Problem # 1:  ABDOMINAL PAIN, LEFT LOWER QUADRANT, HX OF (ICD-V15.89)  Orders: Radiology Referral (Radiology) Prescription Created Electronically [id number]) abdominal xray showed some areas of fecal distention and phleboliths but no acute process. Due to severity of pain and history of colonic polyps/nausea will check a CT abd/pelvis to r/o any mass/diverticulitis.  Problem # 2:  CONSTIPATION (ICD-564.00) MOM 2 tbls in 2-4 oz of warm prune juice by mouth along with Dulcolax suppository pr and repeat if no results. Once system is cleaned out start Pericolace 1-2 caps daily and a fiber supplement with increased hydration and or a yogurt daily. Repeat colonoscopy was recommended by Dr Russella Dar the gentleman who performed his last colonoscopy in 07/2005 in 07/2010. May need to consider sooner repeat if symptoms persist  Problem # 3:  DERMATITIS, ALLERGIC (ICD-692.9)  His updated medication list for this problem includes:    Promethazine Hcl 25 Mg Supp (Promethazine hcl) .Marland Kitchen... 1 every 6-8  hourts  as needed nausea Cleanse skin with Witch Hazel Astringent and may apply Hydrocotisone ointment prn  Complete Medication List: 1)  Actos 45 Mg Tabs (Pioglitazone hcl) .... Take 1 tablet by mouth once a day 2)  Vytorin 10-40 Mg Tabs (Ezetimibe-simvastatin) .... Take 1 tablet by mouth at bedtime 3)  Valtrex 1 Gm Tabs (Valacyclovir hcl) .... Once daily 4)  Niacin 500 Mg Tabs (Niacin) .... Take 1 tablet by mouth once a day 5)  Aspirin 325 Mg Tabs (Aspirin) .... Take 1 tablet by mouth once a day 6)  Folic Acid 400 Mcg Tabs (Folic acid) .... Take 1 tablet by mouth once a day 7)  L-lysine Hcl 500 Mg Tabs (Lysine hcl) .... Take 1 tablet by mouth once a day 8)  Vitamin C 500 Mg Tabs (Ascorbic acid) .... Once daily 9)  Potassium Gluconate 550 Mg Tabs (Potassium gluconate) .... Once daily 10)  Fish Oil 1000 Mg Caps (Omega-3 fatty acids) .... Once daily 11)  Aricept 10 Mg Tabs (Donepezil hcl) .... One  by mouth daily 12)  Lotrisone 1-0.05 % Crea (Clotrimazole-betamethasone) .... Apply to rash bid 13)  Lantus Solostar 100 Unit/ml Soln (Insulin glargine) .... 20 u subcutaneously with dinner meal 14)  Pen Needles 31g X 8 Mm Misc (Insulin pen needle) .... To use as direct with lantus solostar 15)  Onetouch Ultra Test Strp (Glucose blood) .... Marland Kitchenuse as directed 16)  Promethazine Hcl 25 Mg Supp (Promethazine hcl) .Marland Kitchen.. 1 every 6-8  hourts as needed nausea 17)  Cleocin-t 1 % Gel (Clindamycin phosphate) .... Aplly to face two times a day at afect areas 18)  Januvia 100 Mg Tabs (Sitagliptin phosphate) .... One by mouth daily per endocrin consult 19)  Metoclopramide Hcl 5 Mg Tabs (Metoclopramide hcl) .... One table three times a day for nausea. 20)  Tramadol Hcl 50 Mg Tabs (Tramadol hcl) .Marland Kitchen.. 1 tab by mouth three times a day as needed pain 21)  Levsin/sl 0.125 Mg Subl (Hyoscyamine sulfate) .Marland Kitchen.. 1 tab sl as needed abdominal pain, may repeat q 2 hours if pain persists. 22)  Norco 5-325 Mg Tabs  (Hydrocodone-acetaminophen) .Marland Kitchen.. 1-2 tabs by mouth q 4-6 hours prn   Patient Instructions: 1)  Please schedule a follow-up appointment as needed if symptoms worsen or do not improve. 2)  For constipation take Milk of Magnesium 2 tbls by mouth in 2-4 oz of warm prune juice along with a Dulcolax suppository rectally if no or minimal results may repeat in 4-6 hours. 3)  then start Pericolace or Senna S 1-2 caps by mouth once daily for next month and maintain a hi fiber diet (consider a fiber supplement such as Benefiber 2 tsp by mouth daily in 8oz of fluids or yogurt) 4)  Maintain adequate fluid intake, roughly 64 oz daily for a healthy adult Prescriptions: NORCO 5-325 MG TABS (HYDROCODONE-ACETAMINOPHEN) 1-2 tabs by mouth q 4-6 hours prn  #45 x 0   Entered and Authorized by:   Danise Edge MD   Signed by:   Danise Edge MD on 02/16/2010   Method used:   Print then Give to Patient   RxID:   [id number] LEVSIN/SL 0.125 MG SUBL (HYOSCYAMINE SULFATE) 1 tab sl as needed abdominal pain, may repeat q 2 hours if pain persists.  #30 x 1   Entered and Authorized by:   Danise Edge MD   Signed by:   Danise Edge MD on 02/16/2010   Method used:   Electronically to        [facility] [phone]* (retail)       [address]       [place], [place]  [address]       Ph: [phone] or [phone]       Fax: [phone]   RxID:   [id number]

## 2010-08-14 NOTE — Progress Notes (Signed)
Summary: nausea  Phone Note Call from Patient   Caller: Patient walks in office Call For: Stacie Glaze MD Summary of Call: Pt walks in office complaining of nausea several days with occ vomiting.  No fever diarrhea.  Has appt July 12.  [facility]) [phone] Initial call taken by: Lynann Beaver CMA,  January 08, 2010 10:44 AM    Prescriptions: PROMETHAZINE HCL 25 MG SUPP (PROMETHAZINE HCL) 1 every 6-8  hourts as needed nausea  #12 Supposito x 0   Entered by:   Willy Eddy, LPN   Authorized by:   Stacie Glaze MD   Signed by:   Willy Eddy, LPN on 95/62/1308   Method used:   Electronically to        [facility] [phone]* (retail)       [address]       [place], [place]  [address]       Ph: [phone] or [phone]       Fax: [phone]   RxID:   [id number]

## 2010-08-16 NOTE — Letter (Signed)
Summary: Colonoscopy Letter  [facility]  [address] [place], [place] [address]   Phone: [phone]  Fax: [phone]      July 25, 2010 MRN: [id number]   [facility] [address] [place], [place]  [address]   Dear Mr. Shere,   According to your medical record, it is time for you to schedule a Colonoscopy. The American Cancer Society recommends this procedure as a method to detect early colon cancer. Patients with a family history of colon cancer, or a personal history of colon polyps or inflammatory bowel disease are at increased risk.  This letter has been generated based on the recommendations made at the time of your procedure. If you feel that in your particular situation this may no longer apply, please contact our office.  Please call our office at [phone] to schedule this appointment or to update your records at your earliest convenience.  Thank you for cooperating with [place] to provide you with the very best care possible.   Sincerely,  Judie Petit T. Russella Dar, M.D.  [facility] [phone]

## 2010-08-16 NOTE — Assessment & Plan Note (Signed)
Summary: dizzy/nausea/njr   Vital Signs:  Patient profile:   75 year old male Weight:      186 pounds Temp:     98.0 degrees F oral BP sitting:   120 / 80  (left arm) Cuff size:   regular  Vitals Entered By: Duard Brady LPN (June 28, 2010 3:35 PM) CC: c/o nausea and vomiting , increased 'sweating'    fbs 125 Is Patient Diabetic? Yes Did you bring your meter with you today? No   Primary Care Provider:  Stacie Glaze MD  CC:  c/o nausea and vomiting  and increased 'sweating'    fbs 125.  History of Present Illness: 75 year old patient who has a history of mild dementia type 2 diabetes and also a history of hypoglycemia.  Medical regimen includes Lantus insulin.  This morning he awoke apparently feeling well and after a breakfast became quite weak and diaphoretic. He has  felt somewhat weak throughout the day and presented to the office this afternoon as a walk-in.  This afternoon, had a single episode of nausea and vomiting.  He states it had a normal formed bowel movement earlier today.  He has had no further diaphoresis.  Prior to his breakfast.  He states his fasting blood sugar was 125.  He denies any documented low blood sugar readings.  At the present time.  He feels well and back to baseline.  Allergies (verified): No Known Drug Allergies  Past History:  Past Medical History: Reviewed history from 02/19/2007 and no changes required. Diabetes mellitus, type II Hyperlipidemia HSV  Past Surgical History: Reviewed history from 02/13/2007 and no changes required. Colonoscopy-08/01/2005  Review of Systems       The patient complains of anorexia.  The patient denies fever, weight loss, weight gain, vision loss, decreased hearing, hoarseness, chest pain, syncope, dyspnea on exertion, peripheral edema, prolonged cough, headaches, hemoptysis, abdominal pain, melena, hematochezia, severe indigestion/heartburn, hematuria, incontinence, genital sores, muscle weakness,  suspicious skin lesions, transient blindness, difficulty walking, depression, unusual weight change, abnormal bleeding, enlarged lymph nodes, angioedema, breast masses, and testicular masses.    Physical Exam  General:  Well-developed,well-nourished,in no acute distress; alert,appropriate and cooperative throughout examination Head:  Normocephalic and atraumatic without obvious abnormalities. No apparent alopecia or balding. Eyes:  No corneal or conjunctival inflammation noted. EOMI. Perrla. Funduscopic exam benign, without hemorrhages, exudates or papilledema. Vision grossly normal. Mouth:  Oral mucosa and oropharynx without lesions or exudates.  Teeth in good repair. Neck:  No deformities, masses, or tenderness noted. Lungs:  Normal respiratory effort, chest expands symmetrically. Lungs are clear to auscultation, no crackles or wheezes. Heart:  Normal rate and regular rhythm. S1 and S2 normal without gallop, murmur, click, rub or other extra sounds. Abdomen:  Bowel sounds positive,abdomen soft and non-tender without masses, organomegaly or hernias noted. Msk:  No deformity or scoliosis noted of thoracic or lumbar spine.   Pulses:  R and L carotid,radial,femoral,dorsalis pedis and posterior tibial pulses are full and equal bilaterally Extremities:  No clubbing, cyanosis, edema, or deformity noted with normal full range of motion of all joints.     Impression & Recommendations:  Problem # 1:  DIABETES MELLITUS, TYPE II (ICD-250.00)  His updated medication list for this problem includes:    Actos 45 Mg Tabs (Pioglitazone hcl) .Marland Kitchen... Take 1 tablet by mouth once a day    Aspirin 325 Mg Tabs (Aspirin) .Marland Kitchen... Take 1 tablet by mouth once a day    Lantus Solostar 100 Unit/ml  Soln (Insulin glargine) .Marland Kitchen... 20 u subcutaneously with dinner meal    Januvia 100 Mg Tabs (Sitagliptin phosphate) ..... One by mouth daily per endocrin consult  Problem # 2:  MEMORY LOSS (ICD-780.93)  Problem # 3:   HYPERLIPIDEMIA (ICD-272.4)  His updated medication list for this problem includes:    Vytorin 10-40 Mg Tabs (Ezetimibe-simvastatin) .Marland Kitchen... Take 1 tablet by mouth at bedtime    Niacin 500 Mg Tabs (Niacin) .Marland Kitchen... Take 1 tablet by mouth once a day  Complete Medication List: 1)  Actos 45 Mg Tabs (Pioglitazone hcl) .... Take 1 tablet by mouth once a day 2)  Vytorin 10-40 Mg Tabs (Ezetimibe-simvastatin) .... Take 1 tablet by mouth at bedtime 3)  Valtrex 1 Gm Tabs (Valacyclovir hcl) .... Once daily 4)  Niacin 500 Mg Tabs (Niacin) .... Take 1 tablet by mouth once a day 5)  Aspirin 325 Mg Tabs (Aspirin) .... Take 1 tablet by mouth once a day 6)  Folic Acid 400 Mcg Tabs (Folic acid) .... Take 1 tablet by mouth once a day 7)  L-lysine Hcl 500 Mg Tabs (Lysine hcl) .... Take 1 tablet by mouth once a day 8)  Vitamin C 500 Mg Tabs (Ascorbic acid) .... Once daily 9)  Potassium Gluconate 550 Mg Tabs (Potassium gluconate) .... Once daily 10)  Fish Oil 1000 Mg Caps (Omega-3 fatty acids) .... Once daily 11)  Aricept 10 Mg Tabs (Donepezil hcl) .... One by mouth daily 12)  Lotrisone 1-0.05 % Crea (Clotrimazole-betamethasone) .... Apply to rash bid 13)  Lantus Solostar 100 Unit/ml Soln (Insulin glargine) .... 20 u subcutaneously with dinner meal 14)  Pen Needles 31g X 8 Mm Misc (Insulin pen needle) .... To use as direct with lantus solostar 15)  Onetouch Ultra Test Strp (Glucose blood) .... Marland Kitchenuse as directed 16)  Promethazine Hcl 25 Mg Supp (Promethazine hcl) .Marland Kitchen.. 1 every 6-8  hourts as needed nausea 17)  Cleocin-t 1 % Gel (Clindamycin phosphate) .... Aplly to face two times a day at afect areas 18)  Januvia 100 Mg Tabs (Sitagliptin phosphate) .... One by mouth daily per endocrin consult 19)  Metoclopramide Hcl 5 Mg Tabs (Metoclopramide hcl) .... One table three times a day for nausea. 20)  Tramadol Hcl 50 Mg Tabs (Tramadol hcl) .Marland Kitchen.. 1 tab by mouth three times a day as needed pain 21)  Levsin/sl 0.125 Mg Subl  (Hyoscyamine sulfate) .Marland Kitchen.. 1 tab sl as needed abdominal pain, may repeat q 2 hours if pain persists. 22)  Norco 5-325 Mg Tabs (Hydrocodone-acetaminophen) .Marland Kitchen.. 1-2 tabs by mouth q 4-6 hours prn 23)  Nasonex 50 Mcg/act Susp (Mometasone furoate) .Marland Kitchen.. 1 spray each nostril qd  Patient Instructions: 1)  Aciphex- one daily 2)  call if  symptoms recur 3)  Check your blood sugars regularly. If your readings are usually above : or below 70 you should contact our office. 4)  It is important that your Diabetic A1c level is checked every 3 months.   Orders Added: 1)  Est. Patient Level IV [96295]

## 2010-08-22 NOTE — Letter (Signed)
Summary: [facility]  [facility]   Imported By: Maryln Gottron 08/16/2010 13:02:28  _____________________________________________________________________  External Attachment:    Type:   Image     Comment:   External Document

## 2010-09-04 ENCOUNTER — Other Ambulatory Visit: Payer: Self-pay | Admitting: Urology

## 2010-09-04 ENCOUNTER — Ambulatory Visit (HOSPITAL_COMMUNITY)
Admission: RE | Admit: 2010-09-04 | Discharge: 2010-09-04 | Disposition: A | Discharge status: Home / Self Care | Payer: Medicare Other | Source: Ambulatory Visit | Attending: Urology | Admitting: Urology

## 2010-09-04 DIAGNOSIS — C649 Malignant neoplasm of unspecified kidney, except renal pelvis: Secondary | ICD-10-CM | POA: Insufficient documentation

## 2010-09-07 ENCOUNTER — Other Ambulatory Visit: Payer: Self-pay | Admitting: Internal Medicine

## 2010-09-27 ENCOUNTER — Other Ambulatory Visit (INDEPENDENT_AMBULATORY_CARE_PROVIDER_SITE_OTHER): Payer: Medicare Other | Admitting: Internal Medicine

## 2010-09-27 DIAGNOSIS — E119 Type 2 diabetes mellitus without complications: Secondary | ICD-10-CM

## 2010-09-27 LAB — BASIC METABOLIC PANEL WITH GFR
BUN: 15 mg/dL (ref 6–23)
BUN: 20 mg/dL (ref 6–23)
CO2: 23 meq/L (ref 19–32)
CO2: 25 meq/L (ref 19–32)
Calcium: 8.5 mg/dL (ref 8.4–10.5)
Calcium: 8.6 mg/dL (ref 8.4–10.5)
Chloride: 102 meq/L (ref 96–112)
Chloride: 109 meq/L (ref 96–112)
Creatinine, Ser: 1.56 mg/dL — ABNORMAL HIGH (ref 0.4–1.5)
Creatinine, Ser: 1.66 mg/dL — ABNORMAL HIGH (ref 0.4–1.5)
GFR calc non Af Amer: 41 mL/min — ABNORMAL LOW
GFR calc non Af Amer: 44 mL/min — ABNORMAL LOW
Glucose, Bld: 223 mg/dL — ABNORMAL HIGH (ref 70–99)
Glucose, Bld: 231 mg/dL — ABNORMAL HIGH (ref 70–99)
Potassium: 4 meq/L (ref 3.5–5.1)
Potassium: 4.4 meq/L (ref 3.5–5.1)
Sodium: 136 meq/L (ref 135–145)
Sodium: 140 meq/L (ref 135–145)

## 2010-09-27 LAB — POCT I-STAT, CHEM 8
BUN: 22 mg/dL (ref 6–23)
Calcium, Ion: 1.13 mmol/L (ref 1.12–1.32)
Chloride: 103 mEq/L (ref 96–112)
Potassium: 3.9 mEq/L (ref 3.5–5.1)
Sodium: 137 mEq/L (ref 135–145)

## 2010-09-27 LAB — COMPREHENSIVE METABOLIC PANEL WITH GFR
ALT: 21 U/L (ref 0–53)
AST: 30 U/L (ref 0–37)
Albumin: 3.7 g/dL (ref 3.5–5.2)
Alkaline Phosphatase: 78 U/L (ref 39–117)
BUN: 18 mg/dL (ref 6–23)
CO2: 30 meq/L (ref 19–32)
Calcium: 9.2 mg/dL (ref 8.4–10.5)
Chloride: 106 meq/L (ref 96–112)
Creatinine, Ser: 0.97 mg/dL (ref 0.4–1.5)
GFR calc non Af Amer: >60 mL/min
Glucose, Bld: 139 mg/dL — ABNORMAL HIGH (ref 70–99)
Potassium: 4.1 meq/L (ref 3.5–5.1)
Sodium: 140 meq/L (ref 135–145)
Total Bilirubin: 1.4 mg/dL — ABNORMAL HIGH (ref 0.3–1.2)
Total Protein: 7.3 g/dL (ref 6.0–8.3)

## 2010-09-27 LAB — GLUCOSE, CAPILLARY
Glucose-Capillary: 188 mg/dL — ABNORMAL HIGH (ref 70–99)
Glucose-Capillary: 192 mg/dL — ABNORMAL HIGH (ref 70–99)
Glucose-Capillary: 192 mg/dL — ABNORMAL HIGH (ref 70–99)
Glucose-Capillary: 193 mg/dL — ABNORMAL HIGH (ref 70–99)
Glucose-Capillary: 219 mg/dL — ABNORMAL HIGH (ref 70–99)
Glucose-Capillary: 222 mg/dL — ABNORMAL HIGH (ref 70–99)
Glucose-Capillary: 232 mg/dL — ABNORMAL HIGH (ref 70–99)
Glucose-Capillary: 237 mg/dL — ABNORMAL HIGH (ref 70–99)
Glucose-Capillary: 314 mg/dL — ABNORMAL HIGH (ref 70–99)

## 2010-09-27 LAB — ABO/RH: ABO/RH(D): O NEG

## 2010-09-27 LAB — URINALYSIS, ROUTINE W REFLEX MICROSCOPIC
Glucose, UA: 250 mg/dL — AB
Protein, ur: NEGATIVE mg/dL

## 2010-09-27 LAB — CBC
HCT: 33.5 % — ABNORMAL LOW (ref 39.0–52.0)
HCT: 34.5 % — ABNORMAL LOW (ref 39.0–52.0)
HCT: 36.7 % — ABNORMAL LOW (ref 39.0–52.0)
HCT: 39.3 % (ref 39.0–52.0)
HCT: 36.8 % — ABNORMAL LOW (ref 39.0–52.0)
Hemoglobin: 12 g/dL — ABNORMAL LOW (ref 13.0–17.0)
Hemoglobin: 12.6 g/dL — ABNORMAL LOW (ref 13.0–17.0)
Hemoglobin: 13.4 g/dL (ref 13.0–17.0)
MCH: 33.8 pg (ref 26.0–34.0)
MCH: 34 pg (ref 26.0–34.0)
MCH: 34 pg (ref 26.0–34.0)
MCH: 34.1 pg — ABNORMAL HIGH (ref 26.0–34.0)
MCHC: 34 g/dL (ref 30.0–36.0)
MCHC: 34.5 g/dL (ref 30.0–36.0)
MCHC: 34.7 g/dL (ref 30.0–36.0)
MCV: 98 fL (ref 78.0–100.0)
MCV: 98 fL (ref 78.0–100.0)
MCV: 98.8 fL (ref 78.0–100.0)
MCV: 99.3 fL (ref 78.0–100.0)
Platelets: 196 10*3/uL (ref 150–400)
Platelets: 202 K/uL (ref 150–400)
Platelets: 226 K/uL (ref 150–400)
Platelets: 272 K/uL (ref 150–400)
RBC: 3.52 MIL/uL — ABNORMAL LOW (ref 4.22–5.81)
RBC: 3.71 MIL/uL — ABNORMAL LOW (ref 4.22–5.81)
RBC: 3.96 MIL/uL — ABNORMAL LOW (ref 4.22–5.81)
RDW: 13.5 % (ref 11.5–15.5)
RDW: 13.5 % (ref 11.5–15.5)
RDW: 13.6 % (ref 11.5–15.5)
RDW: 13.7 % (ref 11.5–15.5)
RDW: 13.8 % (ref 11.5–15.5)
WBC: 5.4 K/uL (ref 4.0–10.5)
WBC: 7.2 10*3/uL (ref 4.0–10.5)
WBC: 8.5 K/uL (ref 4.0–10.5)
WBC: 9.5 K/uL (ref 4.0–10.5)
WBC: 10 10*3/uL (ref 4.0–10.5)

## 2010-09-27 LAB — BASIC METABOLIC PANEL
Chloride: 104 mEq/L (ref 96–112)
GFR calc Af Amer: >60 mL/min (ref >60)
GFR calc non Af Amer: 56 mL/min — ABNORMAL LOW (ref >60)
Potassium: 3.8 mEq/L (ref 3.5–5.1)
Sodium: 139 mEq/L (ref 135–145)

## 2010-09-27 LAB — DIFFERENTIAL
Basophils Absolute: 0 10*3/uL (ref 0.0–0.1)
Basophils Absolute: 0 10*3/uL (ref 0.0–0.1)
Eosinophils Absolute: 0 10*3/uL (ref 0.0–0.7)
Eosinophils Absolute: 0.1 10*3/uL (ref 0.0–0.7)
Eosinophils Relative: 1 % (ref 0–5)
Lymphocytes Relative: 14 % (ref 12–46)
Lymphocytes Relative: 7 % — ABNORMAL LOW (ref 12–46)
Lymphocytes Relative: 9 % — ABNORMAL LOW (ref 12–46)
Lymphs Abs: 0.6 10*3/uL — ABNORMAL LOW (ref 0.7–4.0)
Monocytes Absolute: 0.7 10*3/uL (ref 0.1–1.0)
Monocytes Absolute: 1 10*3/uL (ref 0.1–1.0)
Monocytes Relative: 6 % (ref 3–12)
Neutro Abs: 8.1 10*3/uL — ABNORMAL HIGH (ref 1.7–7.7)
Neutrophils Relative %: 87 % — ABNORMAL HIGH (ref 43–77)
Neutrophils Relative %: 81 % — ABNORMAL HIGH (ref 43–77)

## 2010-09-27 LAB — TYPE AND SCREEN
ABO/RH(D): O NEG
Antibody Screen: NEGATIVE

## 2010-09-27 LAB — URINE MICROSCOPIC-ADD ON

## 2010-09-27 LAB — SURGICAL PCR SCREEN
MRSA, PCR: NEGATIVE
Staphylococcus aureus: NEGATIVE

## 2010-11-08 ENCOUNTER — Other Ambulatory Visit: Payer: Self-pay | Admitting: Internal Medicine

## 2010-11-14 ENCOUNTER — Other Ambulatory Visit: Payer: Self-pay | Admitting: Internal Medicine

## 2010-11-26 ENCOUNTER — Other Ambulatory Visit: Payer: Self-pay | Admitting: Internal Medicine

## 2010-11-29 ENCOUNTER — Other Ambulatory Visit: Payer: Self-pay | Admitting: Internal Medicine

## 2010-12-04 ENCOUNTER — Other Ambulatory Visit: Payer: Self-pay | Admitting: Urology

## 2010-12-04 ENCOUNTER — Ambulatory Visit (HOSPITAL_COMMUNITY)
Admission: RE | Admit: 2010-12-04 | Discharge: 2010-12-04 | Disposition: A | Discharge status: Home / Self Care | Payer: Medicare Other | Source: Ambulatory Visit | Attending: Urology | Admitting: Urology

## 2010-12-04 DIAGNOSIS — C649 Malignant neoplasm of unspecified kidney, except renal pelvis: Secondary | ICD-10-CM | POA: Insufficient documentation

## 2011-01-11 ENCOUNTER — Other Ambulatory Visit: Payer: Self-pay | Admitting: Internal Medicine

## 2011-01-19 ENCOUNTER — Other Ambulatory Visit: Payer: Self-pay | Admitting: Internal Medicine

## 2011-04-01 ENCOUNTER — Other Ambulatory Visit: Payer: Self-pay | Admitting: Urology

## 2011-04-01 ENCOUNTER — Ambulatory Visit (HOSPITAL_COMMUNITY)
Admission: RE | Admit: 2011-04-01 | Discharge: 2011-04-01 | Disposition: A | Discharge status: Home / Self Care | Payer: Medicare Other | Source: Ambulatory Visit | Attending: Urology | Admitting: Urology

## 2011-04-01 DIAGNOSIS — J449 Chronic obstructive pulmonary disease, unspecified: Secondary | ICD-10-CM | POA: Insufficient documentation

## 2011-04-01 DIAGNOSIS — J4489 Other specified chronic obstructive pulmonary disease: Secondary | ICD-10-CM | POA: Insufficient documentation

## 2011-04-01 DIAGNOSIS — C649 Malignant neoplasm of unspecified kidney, except renal pelvis: Secondary | ICD-10-CM | POA: Insufficient documentation

## 2011-05-12 ENCOUNTER — Other Ambulatory Visit: Payer: Self-pay | Admitting: Internal Medicine

## 2011-06-08 ENCOUNTER — Other Ambulatory Visit: Payer: Self-pay | Admitting: Internal Medicine

## 2011-06-16 ENCOUNTER — Other Ambulatory Visit: Payer: Self-pay | Admitting: Internal Medicine

## 2011-08-01 ENCOUNTER — Ambulatory Visit (HOSPITAL_COMMUNITY)
Admission: RE | Admit: 2011-08-01 | Discharge: 2011-08-01 | Disposition: A | Discharge status: Home / Self Care | Payer: Medicare Other | Source: Ambulatory Visit | Attending: Urology | Admitting: Urology

## 2011-08-01 ENCOUNTER — Other Ambulatory Visit: Payer: Self-pay | Admitting: Urology

## 2011-08-01 DIAGNOSIS — C649 Malignant neoplasm of unspecified kidney, except renal pelvis: Secondary | ICD-10-CM

## 2011-08-03 ENCOUNTER — Other Ambulatory Visit: Payer: Self-pay | Admitting: Internal Medicine

## 2011-08-07 ENCOUNTER — Other Ambulatory Visit: Payer: Self-pay | Admitting: Internal Medicine

## 2011-09-21 ENCOUNTER — Other Ambulatory Visit: Payer: Self-pay | Admitting: Internal Medicine

## 2011-10-22 ENCOUNTER — Other Ambulatory Visit: Payer: Self-pay | Admitting: Dermatology

## 2012-01-28 ENCOUNTER — Other Ambulatory Visit: Payer: Self-pay | Admitting: Internal Medicine

## 2012-03-11 ENCOUNTER — Ambulatory Visit (HOSPITAL_COMMUNITY)
Admission: RE | Admit: 2012-03-11 | Discharge: 2012-03-11 | Disposition: A | Discharge status: Home / Self Care | Payer: Medicare Other | Source: Ambulatory Visit | Attending: Urology | Admitting: Urology

## 2012-03-11 ENCOUNTER — Other Ambulatory Visit: Payer: Self-pay | Admitting: Urology

## 2012-03-11 DIAGNOSIS — C649 Malignant neoplasm of unspecified kidney, except renal pelvis: Secondary | ICD-10-CM

## 2012-03-11 DIAGNOSIS — M5380 Other specified dorsopathies, site unspecified: Secondary | ICD-10-CM | POA: Insufficient documentation

## 2012-03-13 ENCOUNTER — Other Ambulatory Visit: Payer: Self-pay | Admitting: Internal Medicine

## 2012-03-22 ENCOUNTER — Other Ambulatory Visit: Payer: Self-pay | Admitting: Internal Medicine

## 2012-03-24 ENCOUNTER — Other Ambulatory Visit: Payer: Self-pay | Admitting: Dermatology

## 2012-03-30 ENCOUNTER — Telehealth: Payer: Self-pay | Admitting: Internal Medicine

## 2012-03-30 ENCOUNTER — Encounter: Payer: Self-pay | Admitting: Gastroenterology

## 2012-03-30 NOTE — Telephone Encounter (Signed)
Pt informed we could not call in or give any samples- pt h as not been seen in 3 years----pt instructed to make an appointment with md a nd  He went to check out desk and ask for samples again and didn't make an appointment

## 2012-03-30 NOTE — Telephone Encounter (Signed)
Pt would like samples of viagra, cialis or levitra. Pt is requesting the best of three. [facility] [address]

## 2012-04-02 ENCOUNTER — Other Ambulatory Visit: Payer: Self-pay | Admitting: Internal Medicine

## 2012-04-21 ENCOUNTER — Other Ambulatory Visit: Payer: Self-pay | Admitting: Internal Medicine

## 2012-05-08 ENCOUNTER — Ambulatory Visit (INDEPENDENT_AMBULATORY_CARE_PROVIDER_SITE_OTHER): Payer: Medicare Other | Admitting: Internal Medicine

## 2012-05-08 ENCOUNTER — Encounter: Payer: Self-pay | Admitting: Internal Medicine

## 2012-05-08 VITALS — BP 120/70 | HR 72 | Temp 98.3°F | Resp 16 | Ht 72.0 in | Wt 192.0 lb

## 2012-05-08 DIAGNOSIS — N139 Obstructive and reflux uropathy, unspecified: Secondary | ICD-10-CM

## 2012-05-08 DIAGNOSIS — K3184 Gastroparesis: Secondary | ICD-10-CM

## 2012-05-08 DIAGNOSIS — N529 Male erectile dysfunction, unspecified: Secondary | ICD-10-CM

## 2012-05-08 DIAGNOSIS — N138 Other obstructive and reflux uropathy: Secondary | ICD-10-CM

## 2012-05-08 DIAGNOSIS — B009 Herpesviral infection, unspecified: Secondary | ICD-10-CM

## 2012-05-08 DIAGNOSIS — A6000 Herpesviral infection of urogenital system, unspecified: Secondary | ICD-10-CM

## 2012-05-08 DIAGNOSIS — R413 Other amnesia: Secondary | ICD-10-CM

## 2012-05-08 DIAGNOSIS — E1165 Type 2 diabetes mellitus with hyperglycemia: Secondary | ICD-10-CM

## 2012-05-08 DIAGNOSIS — IMO0002 Reserved for concepts with insufficient information to code with codable children: Secondary | ICD-10-CM

## 2012-05-08 DIAGNOSIS — N401 Enlarged prostate with lower urinary tract symptoms: Secondary | ICD-10-CM

## 2012-05-08 DIAGNOSIS — E785 Hyperlipidemia, unspecified: Secondary | ICD-10-CM

## 2012-05-08 LAB — LIPID PANEL
Cholesterol: 291 mg/dL — ABNORMAL HIGH (ref 0–200)
HDL: 41.4 mg/dL (ref >39.00)
Total CHOL/HDL Ratio: 7
Triglycerides: 98 mg/dL (ref 0.0–149.0)
VLDL: 19.6 mg/dL (ref 0.0–40.0)

## 2012-05-08 LAB — HEPATIC FUNCTION PANEL
Albumin: 3.5 g/dL (ref 3.5–5.2)
Total Protein: 7.1 g/dL (ref 6.0–8.3)

## 2012-05-08 LAB — LDL CHOLESTEROL, DIRECT: Direct LDL: 228.9 mg/dL

## 2012-05-08 MED ORDER — TADALAFIL 5 MG PO TABS: 5.0000 mg | ORAL_TABLET | Freq: Every day | ORAL | Status: DC | PRN

## 2012-05-08 MED ORDER — INSULIN GLARGINE 100 UNIT/ML ~~LOC~~ SOLN: SUBCUTANEOUS | Status: DC

## 2012-05-08 MED ORDER — VALACYCLOVIR HCL 500 MG PO TABS: 500.0000 mg | ORAL_TABLET | Freq: Every day | ORAL | Status: DC

## 2012-05-08 NOTE — Patient Instructions (Addendum)
The patient is instructed to continue all medications as prescribed. Schedule followup with check out clerk upon leaving the clinic  

## 2012-05-08 NOTE — Progress Notes (Signed)
Subjective:    Patient ID: Bryan Ramos, male    DOB: 12/01/1935, 76 y.o.   MRN: [id number]  HPI  Renal cells CA and s/p nephrectomy DM  Poorly controlled due to memory Has been seeing endocrine for DM Has not had lipid checks PSA checked by urology Has been taking Viagra from [place]   Review of Systems  Constitutional: Negative for fever and fatigue.  HENT: Negative for hearing loss, congestion, neck pain and postnasal drip.   Eyes: Negative for discharge, redness and visual disturbance.  Respiratory: Negative for cough, shortness of breath and wheezing.   Cardiovascular: Negative for leg swelling.  Gastrointestinal: Negative for abdominal pain, constipation and abdominal distention.  Genitourinary: Negative for urgency and frequency.  Musculoskeletal: Negative for joint swelling and arthralgias.  Skin: Negative for color change and rash.  Neurological: Negative for weakness and light-headedness.  Hematological: Negative for adenopathy.  Psychiatric/Behavioral: Negative for behavioral problems.   Past Medical History  Diagnosis Date  . Renal cell carcinoma   . Hyperlipidemia   . HSV infection   . Alzheimer's dementia     dr Sandria Manly  . Diabetes mellitus without complication     seeing [facility]    History   Social History  . Marital Status: Single    Spouse Name: N/A    Number of Children: N/A  . Years of Education: N/A   Occupational History  . Not on file.   Social History Main Topics  . Smoking status: Never Smoker   . Smokeless tobacco: Not on file  . Alcohol Use: Yes  . Drug Use: No  . Sexually Active: Not on file   Other Topics Concern  . Not on file   Social History Narrative  . No narrative on file    Past Surgical History  Procedure Date  . Removal of kidney   . Nephrectomy     Dr. Letha Cape    Family History  Problem Relation Age of Onset  . Stroke Father   . Alzheimer's disease      No Known Allergies  Current Outpatient  Prescriptions on File Prior to Visit  Medication Sig Dispense Refill  . donepezil (ARICEPT) 10 MG tablet TAKE 1 TABLET BY MOUTH EVERY DAY  30 tablet  5  . VYTORIN 10-40 MG per tablet TAKE 1 TABLET BY MOUTH AT BEDTIME  90 tablet  2  . B-D ULTRAFINE III SHORT PEN 31G X 8 MM MISC USE AS DIRECTED WITH LANTUS SOLOSTAR  100 each  0  . B-D ULTRAFINE III SHORT PEN 31G X 8 MM MISC USE AS DIRECTED WITH LANTUS SOLOSTAR  100 each  11  . insulin glargine (LANTUS SOLOSTAR) 100 UNIT/ML injection Sliding scale per endocrine  5 pen  PRN  . ONE TOUCH ULTRA TEST test strip USE AS DIRECTED  100 each  3  . DISCONTD: ACTOS 45 MG tablet TAKE 1 TABLET BY MOUTH EVERY DAY  90 tablet  2    BP 120/70  Pulse 72  Temp 98.3 F (36.8 C)  Resp 16  Ht 6' (1.829 m)  Wt 192 lb (87.091 kg)  BMI 26.04 kg/m2       Objective:   Physical Exam  Nursing note and vitals reviewed. Constitutional: He appears well-developed and well-nourished.  HENT:  Head: Normocephalic and atraumatic.  Eyes: Conjunctivae normal are normal. Pupils are equal, round, and reactive to light.  Neck: Normal range of motion. Neck supple.  Cardiovascular: Normal rate and regular rhythm.  Pulmonary/Chest: Effort normal and breath sounds normal.  Abdominal: Soft. Bowel sounds are normal.          Assessment & Plan:  Adjustment of medications Dicussed memory loss  reviewed DM Followed by urology and endocroinology and dicussed these consults

## 2012-06-06 ENCOUNTER — Other Ambulatory Visit: Payer: Self-pay | Admitting: Internal Medicine

## 2012-06-08 ENCOUNTER — Other Ambulatory Visit: Payer: Self-pay | Admitting: Internal Medicine

## 2012-07-02 ENCOUNTER — Ambulatory Visit (AMBULATORY_SURGERY_CENTER): Payer: Medicare Other | Admitting: *Deleted

## 2012-07-02 VITALS — Ht 69.0 in | Wt 193.0 lb

## 2012-07-02 DIAGNOSIS — Z1211 Encounter for screening for malignant neoplasm of colon: Secondary | ICD-10-CM

## 2012-07-02 DIAGNOSIS — Z860100 Personal history of colon polyps, unspecified: Secondary | ICD-10-CM

## 2012-07-02 MED ORDER — MOVIPREP 100 G PO SOLR: ORAL | Status: DC

## 2012-07-02 NOTE — Progress Notes (Signed)
After interviewing patient and explaining the prep instruction I was concerned that patient would have difficulty remembering the instructions and keeping blood sugar up, patient gave me permission to call his friend/neighbor Bryan Ramos. Patient also said I could call his wife,Bryan Ramos. After speaking with Bryan Ramos about colonoscopy appointment he told me to call patient's wife. Called patient's wife and explained that I was concerned and when his colonoscopy date was. She assured me that she would be able to help him with instructions and would be here for his colonoscopy. Encouraged her to call us with ANY questions.

## 2012-07-15 ENCOUNTER — Other Ambulatory Visit: Payer: Self-pay | Admitting: Internal Medicine

## 2012-07-16 ENCOUNTER — Encounter: Payer: Self-pay | Admitting: Gastroenterology

## 2012-07-16 ENCOUNTER — Ambulatory Visit (AMBULATORY_SURGERY_CENTER): Payer: Medicare Other | Admitting: Gastroenterology

## 2012-07-16 VITALS — BP 137/80 | HR 55 | Temp 97.2°F | Resp 21 | Ht 69.0 in | Wt 193.0 lb

## 2012-07-16 DIAGNOSIS — D126 Benign neoplasm of colon, unspecified: Secondary | ICD-10-CM

## 2012-07-16 DIAGNOSIS — Z860100 Personal history of colon polyps, unspecified: Secondary | ICD-10-CM

## 2012-07-16 DIAGNOSIS — Z1211 Encounter for screening for malignant neoplasm of colon: Secondary | ICD-10-CM

## 2012-07-16 MED ORDER — SODIUM CHLORIDE 0.9 % IV SOLN: 500.0000 mL | INTRAVENOUS | Status: DC

## 2012-07-16 NOTE — Progress Notes (Signed)
Propofol given over incremental dosages 

## 2012-07-16 NOTE — Op Note (Signed)
[facility] [address] [place], [address]   COLONOSCOPY PROCEDURE REPORT  PATIENT: Bryan Ramos, Bryan Ramos  MR#: [id number] BIRTHDATE: 08-07-1935 , 76  yrs. old GENDER: Male ENDOSCOPIST: Meryl Dare, MD, [facility] PROCEDURE DATE:  07/16/2012 PROCEDURE:   Colonoscopy with snare polypectomy and Colonoscopy with biopsy ASA CLASS:   Class II INDICATIONS:Patient's personal history of adenomatous colon polyps.  MEDICATIONS: MAC sedation, administered by CRNA and propofol (Diprivan) 150mg  IV DESCRIPTION OF PROCEDURE:   After the risks benefits and alternatives of the procedure were thoroughly explained, informed consent was obtained.  A digital rectal exam revealed no abnormalities of the rectum.   The LB [id number]  endoscope was introduced through the anus and advanced to the cecum, which was identified by both the appendix and ileocecal valve. No adverse events experienced.   The quality of the prep was excellent, using MoviPrep  The instrument was then slowly withdrawn as the colon was fully examined.  COLON FINDINGS: Two sessile polyps measuring 5-6 mm in size were found in the transverse colon.  A polypectomy was performed with a cold snare.  The resection was complete and the polyp tissue was completely retrieved.   A sessile polyp measuring 3 mm in size was found in the transverse colon.  A polypectomy was performed with cold forceps.  The resection was complete and the polyp tissue was completely retrieved. Mild diverticulosis was noted in the sigmoid colon. The colon was otherwise normal. There was no diverticulosis, inflammation, polyps or cancers unless previously stated. Retroflexed views revealed small internal hemorrhoids. The time to cecum=3 minutes 25 seconds.  Withdrawal time=9 minutes 56 seconds. The scope was withdrawn and the procedure completed. COMPLICATIONS: There were no complications.  ENDOSCOPIC IMPRESSION: 1.   Two sessile polyps  measuring 5-6 mm in the transverse colon; polypectomy performed with a cold snare 2.   Sessile polyp measuring 3 mm in the transverse colon; polypectomy performed with cold forceps 3.   Mild diverticulosis was noted in the sigmoid colon 4.   Small internal hemorrhoids  RECOMMENDATIONS: 1.  Await pathology results 2.  High fiber diet with liberal fluid intake. 3.  Repeat Colonoscopy in 5 years.  eSigned:  Meryl Dare, MD, [facility] 07/16/2012 8:57 AM

## 2012-07-16 NOTE — Progress Notes (Signed)
Called to room to assist during endoscopic procedure.  Patient ID and intended procedure confirmed with present staff. Received instructions for my participation in the procedure from the performing physician.  

## 2012-07-16 NOTE — Progress Notes (Signed)
Patient did not experience any of the following events: a burn prior to discharge; a fall within the facility; wrong site/side/patient/procedure/implant event; or a hospital transfer or hospital admission upon discharge from the facility. (G8907) Patient did not have preoperative order for IV antibiotic SSI prophylaxis. (G8918)  

## 2012-07-16 NOTE — Patient Instructions (Signed)
YOU HAD AN ENDOSCOPIC PROCEDURE TODAY AT THE [facility]: Refer to the procedure report that was given to you for any specific questions about what was found during the examination.  If the procedure report does not answer your questions, please call your gastroenterologist to clarify.  If you requested that your care partner not be given the details of your procedure findings, then the procedure report has been included in a sealed envelope for you to review at your convenience later.  YOU SHOULD EXPECT: Some feelings of bloating in the abdomen. Passage of more gas than usual.  Walking can help get rid of the air that was put into your GI tract during the procedure and reduce the bloating. If you had a lower endoscopy (such as a colonoscopy or flexible sigmoidoscopy) you may notice spotting of blood in your stool or on the toilet paper. If you underwent a bowel prep for your procedure, then you may not have a normal bowel movement for a few days.  DIET: Your first meal following the procedure should be a light meal and then it is ok to progress to your normal diet.  A half-sandwich or bowl of soup is an example of a good first meal.  Heavy or fried foods are harder to digest and may make you feel nauseous or bloated.  Likewise meals heavy in dairy and vegetables can cause extra gas to form and this can also increase the bloating.  Drink plenty of fluids but you should avoid alcoholic beverages for 24 hours.  ACTIVITY: Your care partner should take you home directly after the procedure.  You should plan to take it easy, moving slowly for the rest of the day.  You can resume normal activity the day after the procedure however you should NOT DRIVE or use heavy machinery for 24 hours (because of the sedation medicines used during the test).    SYMPTOMS TO REPORT IMMEDIATELY: A gastroenterologist can be reached at any hour.  During normal business hours, 8:30 AM to 5:00 PM Monday through Friday,  call [phone].  After hours and on weekends, please call the GI answering service at [phone] who will take a message and have the physician on call contact you.   Following lower endoscopy (colonoscopy or flexible sigmoidoscopy):  Excessive amounts of blood in the stool  Significant tenderness or worsening of abdominal pains  Swelling of the abdomen that is new, acute  Fever of 100F or higher    FOLLOW UP: If any biopsies were taken you will be contacted by phone or by letter within the next 1-3 weeks.  Call your gastroenterologist if you have not heard about the biopsies in 3 weeks.  Our staff will call the home number listed on your records the next business day following your procedure to check on you and address any questions or concerns that you may have at that time regarding the information given to you following your procedure. This is a courtesy call and so if there is no answer at the home number and we have not heard from you through the emergency physician on call, we will assume that you have returned to your regular daily activities without incident.  SIGNATURES/CONFIDENTIALITY: You and/or your care partner have signed paperwork which will be entered into your electronic medical record.  These signatures attest to the fact that that the information above on your After Visit Summary has been reviewed and is understood.  Full responsibility of the confidentiality   of this discharge information lies with you and/or your care-partner  Information on polyps,diverticulosis, hemorrhoids, and high fiber diet given to you today   

## 2012-07-17 ENCOUNTER — Telehealth: Payer: Self-pay | Admitting: *Deleted

## 2012-07-17 NOTE — Telephone Encounter (Signed)
  Follow up Call-  Call back number 07/16/2012  Post procedure Call Back phone  # [phone]  Permission to leave phone message Yes     Patient questions:  Do you have a fever, pain , or abdominal swelling? no Pain Score  0 *  Have you tolerated food without any problems? yes  Have you been able to return to your normal activities? yes  Do you have any questions about your discharge instructions: Diet   no Medications  no Follow up visit  no  Do you have questions or concerns about your Care? no  Actions: * If pain score is 4 or above: No action needed, pain <4.

## 2012-07-21 ENCOUNTER — Encounter: Payer: Self-pay | Admitting: Gastroenterology

## 2012-08-23 ENCOUNTER — Other Ambulatory Visit: Payer: Self-pay | Admitting: Internal Medicine

## 2012-09-17 ENCOUNTER — Encounter: Payer: Medicare Other | Admitting: Internal Medicine

## 2012-09-24 ENCOUNTER — Other Ambulatory Visit: Payer: Self-pay | Admitting: Urology

## 2012-09-24 ENCOUNTER — Ambulatory Visit (HOSPITAL_COMMUNITY)
Admission: RE | Admit: 2012-09-24 | Discharge: 2012-09-24 | Disposition: A | Discharge status: Home / Self Care | Payer: Medicare Other | Source: Ambulatory Visit | Attending: Urology | Admitting: Urology

## 2012-09-24 DIAGNOSIS — Z905 Acquired absence of kidney: Secondary | ICD-10-CM | POA: Insufficient documentation

## 2012-09-24 DIAGNOSIS — C649 Malignant neoplasm of unspecified kidney, except renal pelvis: Secondary | ICD-10-CM

## 2012-09-24 DIAGNOSIS — Z85528 Personal history of other malignant neoplasm of kidney: Secondary | ICD-10-CM | POA: Insufficient documentation

## 2012-09-30 ENCOUNTER — Other Ambulatory Visit: Payer: Self-pay

## 2012-09-30 DIAGNOSIS — A6000 Herpesviral infection of urogenital system, unspecified: Secondary | ICD-10-CM

## 2012-09-30 MED ORDER — EZETIMIBE-SIMVASTATIN 10-40 MG PO TABS: ORAL_TABLET | ORAL | Status: DC

## 2012-09-30 MED ORDER — DONEPEZIL HCL 10 MG PO TABS: ORAL_TABLET | ORAL | Status: DC

## 2012-09-30 MED ORDER — CLOTRIMAZOLE-BETAMETHASONE 1-0.05 % EX CREA: TOPICAL_CREAM | CUTANEOUS | Status: DC

## 2012-09-30 MED ORDER — VALACYCLOVIR HCL 500 MG PO TABS: 500.0000 mg | ORAL_TABLET | Freq: Every day | ORAL | Status: DC

## 2012-10-05 ENCOUNTER — Other Ambulatory Visit: Payer: Self-pay | Admitting: *Deleted

## 2012-10-06 ENCOUNTER — Other Ambulatory Visit: Payer: Self-pay | Admitting: *Deleted

## 2012-10-09 ENCOUNTER — Other Ambulatory Visit: Payer: Self-pay | Admitting: *Deleted

## 2012-10-09 MED ORDER — CLOTRIMAZOLE-BETAMETHASONE 1-0.05 % EX CREA: TOPICAL_CREAM | CUTANEOUS | Status: DC

## 2012-11-30 ENCOUNTER — Telehealth: Payer: Self-pay | Admitting: Neurology

## 2012-12-01 ENCOUNTER — Telehealth: Payer: Self-pay | Admitting: Internal Medicine

## 2012-12-01 MED ORDER — DONEPEZIL HCL 10 MG PO TABS: ORAL_TABLET | ORAL | Status: DC

## 2012-12-01 NOTE — Telephone Encounter (Signed)
Patient called stating that he need a refill of his donepezil 10 mg [facility]poqd [facility] as [facility] is [facility]. Please assist.

## 2012-12-01 NOTE — Telephone Encounter (Signed)
done

## 2012-12-04 ENCOUNTER — Telehealth: Payer: Self-pay | Admitting: Neurology

## 2012-12-14 ENCOUNTER — Encounter: Payer: Self-pay | Admitting: Internal Medicine

## 2012-12-14 ENCOUNTER — Ambulatory Visit (INDEPENDENT_AMBULATORY_CARE_PROVIDER_SITE_OTHER): Payer: Medicare Other | Admitting: Internal Medicine

## 2012-12-14 VITALS — BP 110/78 | HR 72 | Temp 98.2°F | Resp 16 | Ht 69.0 in | Wt 190.0 lb

## 2012-12-14 DIAGNOSIS — K3184 Gastroparesis: Secondary | ICD-10-CM

## 2012-12-14 DIAGNOSIS — R413 Other amnesia: Secondary | ICD-10-CM

## 2012-12-14 DIAGNOSIS — E119 Type 2 diabetes mellitus without complications: Secondary | ICD-10-CM

## 2012-12-14 DIAGNOSIS — R972 Elevated prostate specific antigen [PSA]: Secondary | ICD-10-CM

## 2012-12-14 DIAGNOSIS — T887XXA Unspecified adverse effect of drug or medicament, initial encounter: Secondary | ICD-10-CM

## 2012-12-14 DIAGNOSIS — Z0000 Encounter for general adult medical examination without abnormal findings: Secondary | ICD-10-CM

## 2012-12-14 DIAGNOSIS — L719 Rosacea, unspecified: Secondary | ICD-10-CM

## 2012-12-14 DIAGNOSIS — E785 Hyperlipidemia, unspecified: Secondary | ICD-10-CM

## 2012-12-14 LAB — CBC WITH DIFFERENTIAL/PLATELET
Basophils Relative: 0.9 % (ref 0.0–3.0)
Eosinophils Absolute: 0.2 10*3/uL (ref 0.0–0.7)
Eosinophils Relative: 3.6 % (ref 0.0–5.0)
HCT: 43.1 % (ref 39.0–52.0)
Hemoglobin: 14.6 g/dL (ref 13.0–17.0)
MCHC: 34 g/dL (ref 30.0–36.0)
MCV: 96.2 fl (ref 78.0–100.0)
Monocytes Absolute: 0.5 10*3/uL (ref 0.1–1.0)
Neutro Abs: 3.6 10*3/uL (ref 1.4–7.7)
RBC: 4.48 Mil/uL (ref 4.22–5.81)

## 2012-12-14 LAB — LIPID PANEL
Cholesterol: 168 mg/dL (ref 0–200)
Triglycerides: 65 mg/dL (ref 0.0–149.0)

## 2012-12-14 LAB — BASIC METABOLIC PANEL
CO2: 27 mEq/L (ref 19–32)
Calcium: 9.2 mg/dL (ref 8.4–10.5)
Creatinine, Ser: 1.5 mg/dL (ref 0.4–1.5)
GFR: 47.04 mL/min — ABNORMAL LOW (ref >60.00)
Sodium: 139 mEq/L (ref 135–145)

## 2012-12-14 LAB — HEPATIC FUNCTION PANEL
ALT: 20 U/L (ref 0–53)
Albumin: 3.8 g/dL (ref 3.5–5.2)
Total Bilirubin: 1.8 mg/dL — ABNORMAL HIGH (ref 0.3–1.2)
Total Protein: 6.7 g/dL (ref 6.0–8.3)

## 2012-12-14 MED ORDER — DONEPEZIL HCL 23 MG PO TABS: ORAL_TABLET | ORAL | Status: DC

## 2012-12-14 MED ORDER — CLOTRIMAZOLE-BETAMETHASONE 1-0.05 % EX CREA: TOPICAL_CREAM | CUTANEOUS | Status: DC

## 2012-12-14 MED ORDER — ROSAC 10-5 % EX CREA: 1.0000 "application " | TOPICAL_CREAM | Freq: Two times a day (BID) | CUTANEOUS | Status: AC

## 2012-12-14 NOTE — Patient Instructions (Signed)
The patient is instructed to continue all medications as prescribed. Schedule followup with check out clerk upon leaving the clinic  

## 2012-12-14 NOTE — Assessment & Plan Note (Signed)
Change the Aricept to times release 12/14/2012

## 2012-12-14 NOTE — Progress Notes (Signed)
Subjective:    Patient ID: Bryan Ramos, male    DOB: 12-26-1935, 77 y.o.   MRN: [id number]  HPI  Bryan Ramos male who presents for his yearly Medicare examination and also for acute monitoring of his diabetes history of elevated PSA Gastroparesis related to diabetes poorly controlled  constipation Hyperlipidemia Chronic memory loss consistent with Alzheimer's type dementia Gained weight due to "overeating"  Review of Systems  Constitutional: Negative for fever and fatigue.  HENT: Negative for hearing loss, congestion, neck pain and postnasal drip.   Eyes: Negative for discharge, redness and visual disturbance.  Respiratory: Negative for cough, shortness of breath and wheezing.   Cardiovascular: Negative for chest pain, palpitations and leg swelling.  Gastrointestinal: Negative for abdominal pain, constipation and abdominal distention.  Genitourinary: Negative for urgency and frequency.  Musculoskeletal: Positive for back pain. Negative for joint swelling and arthralgias.  Skin: Negative for color change and rash.  Neurological: Negative for weakness and light-headedness.  Hematological: Negative for adenopathy.  Psychiatric/Behavioral: Positive for confusion and decreased concentration. Negative for behavioral problems.   Past Medical History  Diagnosis Date  . Renal cell carcinoma   . Hyperlipidemia   . HSV infection   . Alzheimer's dementia     dr Sandria Manly  . Diabetes mellitus without complication     seeing [facility]    History   Social History  . Marital Status: Single    Spouse Name: N/A    Number of Children: N/A  . Years of Education: N/A   Occupational History  . Not on file.   Social History Main Topics  . Smoking status: Never Smoker   . Smokeless tobacco: Never Used  . Alcohol Use: No  . Drug Use: No  . Sexually Active: Not on file   Other Topics Concern  . Not on file   Social History Narrative  . No narrative on file    Past Surgical  History  Procedure Laterality Date  . Removal of kidney    . Nephrectomy      Dr. Letha Cape  . Tonsillectomy      Family History  Problem Relation Age of Onset  . Stroke Father   . Alzheimer's disease    . Colon cancer Neg Hx     No Known Allergies  Current Outpatient Prescriptions on File Prior to Visit  Medication Sig Dispense Refill  . B-D ULTRAFINE III SHORT PEN 31G X 8 MM MISC USE AS DIRECTED WITH LANTUS SOLOSTAR  100 each  11  . CIALIS 5 MG tablet TAKE 1 TABLET EVERY DAY AS NEEDED FOR ERECTILE DYSFUNCTION  10 tablet  0  . clotrimazole-betamethasone (LOTRISONE) cream APPLY TO RASH TWICE A DAY  45 g  3  . donepezil (ARICEPT) 10 MG tablet TAKE 1 TABLET BY MOUTH EVERY DAY  100 tablet  1  . ezetimibe (ZETIA) 10 MG tablet Take 10 mg by mouth daily.      . ONE TOUCH ULTRA TEST test strip USE AS DIRECTED  100 each  3  . simvastatin (ZOCOR) 40 MG tablet Take 40 mg by mouth every evening.      . SitaGLIPtin Phosphate (JANUVIA PO) Take 1 tablet by mouth.      . valACYclovir (VALTREX) 500 MG tablet Take 1 tablet (500 mg total) by mouth daily.  180 tablet  1   No current facility-administered medications on file prior to visit.    BP 110/78  Pulse 72  Temp(Src) 98.2 F (36.8 C)  Resp 16  Ht 5\' 9"  (1.753 m)  Wt 190 lb (86.183 kg)  BMI 28.05 kg/m2       Objective:   Physical Exam  Nursing note and vitals reviewed. Constitutional: He appears well-developed and well-nourished.  HENT:  Head: Normocephalic and atraumatic.  Eyes: Conjunctivae are normal. Pupils are equal, round, and reactive to light.  Neck: Normal range of motion. Neck supple.  Cardiovascular: Normal rate and regular rhythm.   Pulmonary/Chest: Effort normal and breath sounds normal.  Abdominal: Soft. Bowel sounds are normal.  Musculoskeletal: He exhibits tenderness.  Neurological: No cranial nerve deficit. Coordination normal.  Skin: Skin is warm and dry.          Assessment & Plan:   Subjective:     Bryan Ramos is a 77 y.o. male who presents for Medicare Annual/Subsequent preventive examination.   Preventive Screening-Counseling & Management  Tobacco History  Smoking status  . Never Smoker   Smokeless tobacco  . Never Used    Problems Prior to Visit 1.   Current Problems (verified) Patient Active Problem List   Diagnosis Date Noted  . MALIGNANT NEOPLASM OF KIDNEY EXCEPT PELVIS 02/16/2010  . CONSTIPATION 02/16/2010  . DERMATITIS, ALLERGIC 02/16/2010  . GASTROPARESIS 01/23/2010  . LUMBAR RADICULOPATHY, LEFT 01/23/2010  . History of [name] infection 11/28/2009  . ACNE ROSACEA 09/05/2009  . Alzheimer's disease 07/26/2008  . ELEVATED PROSTATE SPECIFIC ANTIGEN 01/21/2008  . UNS ADVRS EFF UNS RX MEDICINAL&BIOLOGICAL SBSTNC 09/08/2007  . DIABETES MELLITUS, TYPE II 02/19/2007  . HYPERLIPIDEMIA 02/19/2007  . HSV 02/13/2007    Medications Prior to Visit Current Outpatient Prescriptions on File Prior to Visit  Medication Sig Dispense Refill  . B-D ULTRAFINE III SHORT PEN 31G X 8 MM MISC USE AS DIRECTED WITH LANTUS SOLOSTAR  100 each  11  . CIALIS 5 MG tablet TAKE 1 TABLET EVERY DAY AS NEEDED FOR ERECTILE DYSFUNCTION  10 tablet  0  . ezetimibe (ZETIA) 10 MG tablet Take 10 mg by mouth daily.      . ONE TOUCH ULTRA TEST test strip USE AS DIRECTED  100 each  3  . simvastatin (ZOCOR) 40 MG tablet Take 40 mg by mouth every evening.      . SitaGLIPtin Phosphate (JANUVIA PO) Take 1 tablet by mouth.      . valACYclovir (VALTREX) 500 MG tablet Take 1 tablet (500 mg total) by mouth daily.  180 tablet  1   No current facility-administered medications on file prior to visit.    Current Medications (verified) Current Outpatient Prescriptions  Medication Sig Dispense Refill  . B-D ULTRAFINE III SHORT PEN 31G X 8 MM MISC USE AS DIRECTED WITH LANTUS SOLOSTAR  100 each  11  . CIALIS 5 MG tablet TAKE 1 TABLET EVERY DAY AS NEEDED FOR ERECTILE DYSFUNCTION  10 tablet  0  .  clotrimazole-betamethasone (LOTRISONE) cream APPLY TO RASH in groin area TWICE A DAY  45 g  3  . donepezil (ARICEPT) 23 MG TABS tablet TAKE 1 TABLET BY MOUTH EVERY DAY  90 tablet  2  . ezetimibe (ZETIA) 10 MG tablet Take 10 mg by mouth daily.      . insulin glargine (LANTUS) 100 UNIT/ML injection 2 (two) times daily. Sliding scale per endocrine      . ONE TOUCH ULTRA TEST test strip USE AS DIRECTED  100 each  3  . simvastatin (ZOCOR) 40 MG tablet Take 40 mg by mouth every evening.      Marland Kitchen  SitaGLIPtin Phosphate (JANUVIA PO) Take 1 tablet by mouth.      . Sulfacetamide-Sulfur-Sunscreen (ROSAC) 10-5 % CREA Apply 1 application topically 2 (two) times daily.  45 g  3  . valACYclovir (VALTREX) 500 MG tablet Take 1 tablet (500 mg total) by mouth daily.  180 tablet  1   No current facility-administered medications for this visit.     Allergies (verified) Review of patient's allergies indicates no known allergies.   PAST HISTORY  Family History Family History  Problem Relation Age of Onset  . Stroke Father   . Alzheimer's disease    . Colon cancer Neg Hx     Social History History  Substance Use Topics  . Smoking status: Never Smoker   . Smokeless tobacco: Never Used  . Alcohol Use: No    Are there smokers in your home (other than you)?  No  Risk Factors Current exercise habits: Home exercise routine includes stretching and treadmill. Gym/ health club routine includes basketball and jogging on track .  Dietary issues discussed: stable    Cardiac risk factors: advanced age (older than 2 for men, 70 for women), diabetes mellitus, dyslipidemia, hypertension and male gender.  Depression Screen (Note: if answer to either of the following is "Yes", a more complete depression screening is indicated)   Q1: Over the past two weeks, have you felt down, depressed or hopeless? No  Q2: Over the past two weeks, have you felt little interest or pleasure in doing things? No  Have you lost  interest or pleasure in daily life? No  Do you often feel hopeless? No  Do you cry easily over simple problems? No  Activities of Daily Living In your present state of health, do you have any difficulty performing the following activities?:  Driving? Yes Managing money?  Yes Feeding yourself? No Getting from bed to chair? No Climbing a flight of stairs? No Preparing food and eating?: No Bathing or showering? No Getting dressed: No Getting to the toilet? No Using the toilet:No Moving around from place to place: No In the past year have you fallen or had a near fall?:No   Are you sexually active?  Yes  Do you have more than one partner?  No  Hearing Difficulties: No Do you often ask people to speak up or repeat themselves? No Do you experience ringing or noises in your ears? No Do you have difficulty understanding soft or whispered voices? No   Do you feel that you have a problem with memory? Yes  Do you often misplace items? Yes  Do you feel safe at home?  Yes  Cognitive Testing  Alert? Yes  Normal Appearance?Yes  Oriented to person? Yes  Place? Yes   Time? Yes  Recall of three objects?  No  Can perform simple calculations? No  Displays appropriate judgment?No  Can read the correct time from a watch face?No   Advanced Directives have been discussed with the patient? Yes   List the Names of Other Physician/Practitioners you currently use: 1.    Indicate any recent Medical Services you may have received from other than [facility] providers in the past year (date may be approximate).  Immunization History  Administered Date(s) Administered  . Influenza Whole 07/15/2005  . Pneumococcal Polysaccharide 07/15/2006  . Td 07/15/2006    Screening Tests Health Maintenance  Topic Date Due  . Zostavax  12/15/1997  . Influenza Vaccine  03/15/2013  . Tetanus/tdap  07/15/2016  . Colonoscopy  07/16/2017  .  Pneumococcal Polysaccharide Vaccine Age 36 And Over  Completed    All  answers were reviewed with the patient and necessary referrals were made:  Carrie Mew, MD   12/14/2012   History reviewed: allergies, current medications, past family history, past medical history, past social history, past surgical history and problem list  Review of Systems Pertinent items are noted in HPI.    Objective:     Vision by Snellen chart: right eye:20/20, left eye:20/20 Blood pressure 110/78, pulse 72, temperature 98.2 F (36.8 C), resp. rate 16, height 5\' 9"  (1.753 m), weight 190 lb (86.183 kg). Body mass index is 28.05 kg/(m^2).  Per the problem  Portio n of the visit     Assessment:      Patient presents for yearly preventative medicine examination.   all immunizations and health maintenance protocols were reviewed with the patient and they are up to date with these protocols.   screening laboratory values were reviewed with the patient including screening of hyperlipidemia PSA renal function and hepatic function.   There medications past medical history social history problem list and allergies were reviewed in detail.   Goals were established with regard to weight loss exercise diet in compliance with medications      Plan:     During the course of the visit the patient was educated and counseled about appropriate screening and preventive services including:    Td vaccine  Prostate cancer screening  Diabetes screening  Diet review for nutrition referral? Yes ____  Not Indicated ____   Patient Instructions (the written plan) was given to the patient.  Medicare Attestation I have personally reviewed: The patient's medical and social history Their use of alcohol, tobacco or illicit drugs Their current medications and supplements The patient's functional ability including ADLs,fall risks, home safety risks, cognitive, and hearing and visual impairment Diet and physical activities Evidence for depression or mood disorders  The patient's  weight, height, BMI, and visual acuity have been recorded in the chart.  I have made referrals, counseling, and provided education to the patient based on review of the above and I have provided the patient with a written personalized care plan for preventive services.     Carrie Mew, MD   12/14/2012

## 2012-12-22 ENCOUNTER — Other Ambulatory Visit: Payer: Self-pay | Admitting: *Deleted

## 2012-12-22 MED ORDER — CIPROFLOXACIN HCL 500 MG PO TABS: 500.0000 mg | ORAL_TABLET | Freq: Two times a day (BID) | ORAL | Status: DC

## 2013-01-01 ENCOUNTER — Other Ambulatory Visit: Payer: Medicare Other

## 2013-01-04 ENCOUNTER — Encounter: Payer: Self-pay | Admitting: Family Medicine

## 2013-01-04 ENCOUNTER — Ambulatory Visit (INDEPENDENT_AMBULATORY_CARE_PROVIDER_SITE_OTHER): Payer: Medicare Other | Admitting: Family Medicine

## 2013-01-04 ENCOUNTER — Other Ambulatory Visit (INDEPENDENT_AMBULATORY_CARE_PROVIDER_SITE_OTHER): Payer: Medicare Other

## 2013-01-04 VITALS — BP 122/86 | Temp 98.0°F | Wt 185.0 lb

## 2013-01-04 DIAGNOSIS — N419 Inflammatory disease of prostate, unspecified: Secondary | ICD-10-CM

## 2013-01-04 DIAGNOSIS — E119 Type 2 diabetes mellitus without complications: Secondary | ICD-10-CM

## 2013-01-04 DIAGNOSIS — R972 Elevated prostate specific antigen [PSA]: Secondary | ICD-10-CM

## 2013-01-04 DIAGNOSIS — R110 Nausea: Secondary | ICD-10-CM

## 2013-01-04 MED ORDER — ONDANSETRON HCL 4 MG PO TABS: 4.0000 mg | ORAL_TABLET | Freq: Three times a day (TID) | ORAL | Status: AC | PRN

## 2013-01-04 NOTE — Progress Notes (Signed)
Chief Complaint  Patient presents with  . Nausea    diarrhea and some vomiting a few days ago     HPI:  Acute visit for nausea: -77 yo pt of Dr. Lovell Sheehan with PMH RCC, alzheimer's dementia, DM, gastroparesis, constipation - recently seen by PCP -reports: taking cipro from PCP and this has made him a little sick on his stomach - wife was out of town and wants to know what rx is for and if needs to keep taking -able to tolerate POs -denies: vomiting the last few days, no diarrhea last few days  DM: -wife wants help with meal planning for this  ROS: See pertinent positives and negatives per HPI.  Past Medical History  Diagnosis Date  . Renal cell carcinoma   . Hyperlipidemia   . HSV infection   . Alzheimer's dementia     dr Sandria Manly  . Diabetes mellitus without complication     seeing [facility]    Family History  Problem Relation Age of Onset  . Stroke Father   . Alzheimer's disease    . Colon cancer Neg Hx     History   Social History  . Marital Status: Single    Spouse Name: N/A    Number of Children: N/A  . Years of Education: N/A   Social History Main Topics  . Smoking status: Never Smoker   . Smokeless tobacco: Never Used  . Alcohol Use: No  . Drug Use: No  . Sexually Active: None   Other Topics Concern  . None   Social History Narrative  . None    Current outpatient prescriptions:B-D ULTRAFINE III SHORT PEN 31G X 8 MM MISC, USE AS DIRECTED WITH LANTUS SOLOSTAR, Disp: 100 each, Rfl: 11;  CIALIS 5 MG tablet, TAKE 1 TABLET EVERY DAY AS NEEDED FOR ERECTILE DYSFUNCTION, Disp: 10 tablet, Rfl: 0;  ciprofloxacin (CIPRO) 500 MG tablet, Take 1 tablet (500 mg total) by mouth 2 (two) times daily., Disp: 28 tablet, Rfl: 0 clotrimazole-betamethasone (LOTRISONE) cream, APPLY TO RASH in groin area TWICE A DAY, Disp: 45 g, Rfl: 3;  donepezil (ARICEPT) 23 MG TABS tablet, TAKE 1 TABLET BY MOUTH EVERY DAY, Disp: 90 tablet, Rfl: 2;  ezetimibe (ZETIA) 10 MG tablet, Take  10 mg by mouth daily., Disp: , Rfl: ;  insulin glargine (LANTUS) 100 UNIT/ML injection, 2 (two) times daily. Sliding scale per endocrine, Disp: , Rfl:  ONE TOUCH ULTRA TEST test strip, USE AS DIRECTED, Disp: 100 each, Rfl: 3;  simvastatin (ZOCOR) 40 MG tablet, Take 40 mg by mouth every evening., Disp: , Rfl: ;  SitaGLIPtin Phosphate (JANUVIA PO), Take 1 tablet by mouth., Disp: , Rfl: ;  Sulfacetamide-Sulfur-Sunscreen (ROSAC) 10-5 % CREA, Apply 1 application topically 2 (two) times daily., Disp: 45 g, Rfl: 3 valACYclovir (VALTREX) 500 MG tablet, Take 1 tablet (500 mg total) by mouth daily., Disp: 180 tablet, Rfl: 1;  ondansetron (ZOFRAN) 4 MG tablet, Take 1 tablet (4 mg total) by mouth every 8 (eight) hours as needed for nausea., Disp: 20 tablet, Rfl: 0  EXAM:  Filed Vitals:   01/04/13 1407  BP: 122/86  Temp: 98 F (36.7 C)    Body mass index is 27.31 kg/(m^2).  GENERAL: vitals reviewed and listed above, alert, oriented, appears well hydrated and in no acute distress  HEENT: atraumatic, conjunttiva clear, no obvious abnormalities on inspection of external nose and ears  NECK: no obvious masses on inspection  LUNGS: clear to auscultation bilaterally, no wheezes,  rales or rhonchi, good air movement  CV: HRRR, no peripheral edema  ABD: soft, NTTP, no rebound or guarding  MS: moves all extremities without noticeable abnormality  PSYCH: pleasant and cooperative, no obvious depression or anxiety  ASSESSMENT AND PLAN:  Discussed the following assessment and plan:  Nausea alone - Plan: ondansetron (ZOFRAN) 4 MG tablet  Prostatitis  -continue cipro as is feeling better - updated them that this if for the elevated PSA -zofran if needed for mild nausea that is likely related to the nausea -advised to follow up with PCP in regards to rechecking PSA -discussed diet - wife wants more help with diet and snack and meal planning, refer to nutrition/diabetes educator for help with  diet -Patient advised to return or notify a doctor immediately if symptoms worsen or persist or new concerns arise.  There are no Patient Instructions on file for this visit.   Kriste Basque R.

## 2013-01-06 ENCOUNTER — Encounter: Payer: Self-pay | Admitting: *Deleted

## 2013-01-06 ENCOUNTER — Other Ambulatory Visit: Payer: Self-pay | Admitting: Dermatology

## 2013-01-06 ENCOUNTER — Encounter: Payer: Medicare Other | Attending: Internal Medicine | Admitting: *Deleted

## 2013-01-06 VITALS — Ht 69.0 in | Wt 183.3 lb

## 2013-01-06 DIAGNOSIS — E119 Type 2 diabetes mellitus without complications: Secondary | ICD-10-CM

## 2013-01-06 DIAGNOSIS — Z713 Dietary counseling and surveillance: Secondary | ICD-10-CM | POA: Insufficient documentation

## 2013-01-06 NOTE — Progress Notes (Signed)
Medical Nutrition Therapy:  Appt start time: 1530 end time:  1630.  Assessment:  Primary concern today: type 2 diabetes. Patient with diabetes for 15-20 years. He reports that he has minimal prior education. His eating schedule is very consistent, but he does like to eat sweets. He takes Lantus bid, and reports frequent low blood glucose between breakfast/lunch and before bed. His meals are generally low in carbs, but snacks frequently on higher carb foods. He is active three days weekly playing basketball. HgbA1c 7.4.   MEDICATIONS: Lantus 22 units AM, 15 units PM, Januvia, Zofran, Zocor,    DIETARY INTAKE:   Usual eating pattern includes 3 meals and 3 snacks per day.  24-hr recall:  B ( AM): Sausage patty, whole wheat toast, coffee (sweet and low, cream), sometimes eggs  Snk ( AM): Crystal light lemonade, Fiber One bar  L (2:30-3 PM): M: meatloaf, green beans, slaw; W: grilled chicken, green beans, slaw; F: Sliced BBQ pork;  Snk ( PM): Peanut butter with apple/gingersnaps  D ( PM): Same as lunch Snk ( PM): Sugar free ice cream, Reece's cups  Beverages: Coffee, water, Diet Dr. Reino Kent   Usual physical activity: Basketball MWF, 2 hours,   Estimated energy needs: 2000 calories 250 g carbohydrates 125 g protein 56 g fat  Progress Towards Goal(s):  In progress.   Nutritional Diagnosis:  NB-1.1 Food and nutrition-related knowledge deficit As related to diabetes.  As evidenced by no prior education.    Intervention:  Nutrition counseling. We discussed basic carb counting, including foods with carbs, label reading, portion size, and meal planning. We also discussed hypoglycemia protocol.  Goals:  1. 3-4 carb servings at meals, 1 serving at snacks. Make sure to have a minimum of 2 carbs at meals, but balance intake throughout the day. 2. Monitor portion size of carb containing foods.  3. Read food labels for carbohydrate content.  4. Don't go more than 4 hours without a healthy source of  carbs.   Handouts given during visit include:  Count Your Carbs booklet  Yellow meal plan card  Monitoring/Evaluation:  Dietary intake, exercise, BG, and body weight prn.

## 2013-01-18 ENCOUNTER — Telehealth: Payer: Self-pay | Admitting: Neurology

## 2013-01-26 NOTE — Progress Notes (Signed)
Pt informed

## 2013-01-27 ENCOUNTER — Ambulatory Visit: Payer: Self-pay | Admitting: Neurology

## 2013-02-01 ENCOUNTER — Encounter: Payer: Self-pay | Admitting: Neurology

## 2013-02-01 ENCOUNTER — Ambulatory Visit (INDEPENDENT_AMBULATORY_CARE_PROVIDER_SITE_OTHER): Payer: Medicare Other | Admitting: Neurology

## 2013-02-01 VITALS — BP 104/68 | HR 61 | Ht 68.0 in | Wt 182.0 lb

## 2013-02-01 DIAGNOSIS — R413 Other amnesia: Secondary | ICD-10-CM

## 2013-02-01 NOTE — Progress Notes (Signed)
HPI: Bryan Ramos is a pleasant 77y/o gentleman with a history of slowly progressive cognitive decline presenting for follow up visit, with his last appointment being on 06/03/2012 with Dr Sandria Manly.  Overall: Patient thinks memory is slightly worse compared to prior visit but overall he thinks he is doing well. He continues to drive, has not had any trouble with getting places,though reports he has gotten lost a few times. He continues to manage the finances, he notes no difficulties. He continues to take Aricept 23mg , did not notice any difference when going from 10 to 23mg  of Aricept. Has not noticed any benefit from this medication. He denies any hallucinatons. Sleeping well. Remains active.   Wife feels there has been some decline, she notes there have some missed bills paid, having more difficulty with short term memory. Still good memory with long term. Wife feels his driving is still good, she has no concerns over him driving.   He remains active, continues to workout and play basketball on a regular basis.   MMSE today declined from a 22/30 on 05/2012 to a 17/30 today.   prior note of Dr Sandria Manly on 05/2012: 77 year old right-handed white  married  male with a 5 yr history of progressive memory loss and family history of memory loss evaluated with an MRI study of the brain, TSH, B12,and RPR. I  thought there was hippocampal atrophy on the MRI of the brain. He is on Aricept 10 mg daily. He was  first seen 03/27/09  with MMSE 23/30, clock drawing task 4/4, 16 four legged animals in one minute. Repeat evaluation 05/29/09 with MMSE 26/30, clock drawing test 3/4, and 12 four legged animals in one minute. Evaluation 04/03/11 with MMSE 24/30, clock drawing task 4/4, and animal fluency 11. Evaluation 09/17/2011 MMSE 26/30. CDT 4/4. AFT 16. He is independent with activities of daily living. He lives with his wife, married for 1-1/2 years.   He exercises on a regular basis. He uses a brace on his left leg and has  restarted playing basketball for one and a  half hours at a time, Mondays, Wednesdays, and Fridays.. He has left lateral knee numbness without significant pain.There is no history of drug or alcohol use. He denies depression, falls, hallucinations or bowel or bladder incontinence. He is sleeping well.He has no falls. He denies depression. He denies problems driving. is not playing card games use a computeror perform memory games. His socialization is traveling to [facility] to see his wife. She states he doesn't talk much improved.He checks his capillary blood glucoses twice per day. 05/21/2012= MMSE 22/30. Clock drawing task 4/4. Animal fluency test 13. Myrtis Ser index of independence in activities of daily living 6. Lawton-Brody instrumental activities of daily living scale 6. Neuropsychiatric inventory= irritability 4. Geriatric depression scale 0/15. Falls assessment tool score 2.   ROS: Constitutional: Denies fever, weight loss, weight gain Eyes: Denies blurry vision, loss of vision, eye pain CV: Denies chest pain, palpitations, syncope Pulm: Denies SOB, dyspnea, cough GI:  Denies constipation, diarrhea, abdominal pain MSK: Denies spasms, muscle pain, weakness Neuro:+ memory loss Denies HA, vertigo, falls, tremor Psyc:  Denies depression, hallucinatons, confusion Hem/lymph: Denies easy bleeding, bruising, no swollen nodes Allergic: +runny nose, hives, rashes  All other ROS are negative     Exam: Gen: NAD, conversant Eyes: anicteric sclerae, moist conjunctivae HENT: Atraumati Neck: Trachea midline; supple,  Lungs: CTA, no wheezing, rales, rhonic  CV: RRR, no MRG Abdomen: Soft, non-tender;  Extremities: No peripheral edema  Skin: Normal temperature, no rash,  Psych: Appropriate affect, pleasant  Neuro: MS: see MMSE score of 17/30, declined from 22/30 on 05/2012  CN: PERRL, EOMI no nystagmus, no ptosis, sensation intact to LT V1-V3 bilat,  face symmetric, no weakness, hearing grossly intact, palate elevates symmetrically, shoulder shrug 5/5 bilat,  tongue protrudes midline, no fasiculations noted.  Motor: normal bulk and tone Strength: 5/5  In all extremities  Coord: rapid alternating and point-to-point (FNF, HTS) movements intact.  Reflexes: symmetrical, bilat downgoing toes  Sens: LT intact in all extremities  Gait: posture, stance, stride and arm-swing normal. Tandem gait intact. Able to walk on heels and toes. Romberg absent.    Assessment/Plan:   Bryan Armijo is a pleasant 77y/o gentleman with a chronic progressive decline in his cognitive function most consistent with a diagnosis of Alzheimers type dementia. He returns today for follow up with his wife, who notes concern that he has had a slight worsening in his cognitive function. His wife expresses an interest in trying an alternative to Aricept which she feels has not given hima robust benefit.   1) Cognitive decline -most consistent with a diagnosis of Alzheimers type dementia -will switch from Aricept to Exelon patch. Will initially start with a 9.5mg  patch and can titrate up as needed/tolerated -in the future can consider addition of Namenda as tolerated -will follow up in 3 to 4 months or early as needed  A total of 30 minutes was spent in with this patient. Over half this time was spent on counseling patient on the diagnosis and different therapeutic options available. Specifically time was spent discussing driving safety, progression of the disease and medication alternatives.

## 2013-02-01 NOTE — Patient Instructions (Signed)
Overall you are doing fairly well but I do want to suggest a few things today:   As far as your medications are concerned, I would like to suggest we switch you from Aricept 23mg  to a Exelon patch 9.5mg . Discontinue the Aricept and then the next morning please start using the Exelon patch. You will place one patch daily, please alternate sights from R to L. You can place it on the chest and back.  I would like to see you back in 4 to 6 months, sooner if we need to. Please call us with any interim questions, concerns, problems, updates or refill requests.   Please also call us for any test results so we can go over those with you on the phone.  My clinical assistant and will answer any of your questions and relay your messages to me and also relay most of my messages to you.   Our phone number is [phone]. We also have an after hours call service for urgent matters and there is a physician on-call for urgent questions. For any emergencies you know to call 911 or go to the nearest emergency room

## 2013-02-18 ENCOUNTER — Telehealth: Payer: Self-pay | Admitting: Internal Medicine

## 2013-02-18 DIAGNOSIS — A6000 Herpesviral infection of urogenital system, unspecified: Secondary | ICD-10-CM

## 2013-02-18 MED ORDER — VALACYCLOVIR HCL 500 MG PO TABS: 500.0000 mg | ORAL_TABLET | Freq: Every day | ORAL | Status: DC

## 2013-02-18 NOTE — Telephone Encounter (Signed)
PT wife is calling to request a refill of the pt's valACYclovir (VALTREX) 500 MG tablet. [facility] Please assist.

## 2013-02-18 NOTE — Telephone Encounter (Signed)
done

## 2013-02-19 ENCOUNTER — Emergency Department (HOSPITAL_COMMUNITY)
Admission: EM | Admit: 2013-02-19 | Discharge: 2013-02-19 | Disposition: A | Discharge status: Home / Self Care | Payer: Medicare Other | Attending: Emergency Medicine | Admitting: Emergency Medicine

## 2013-02-19 ENCOUNTER — Emergency Department (HOSPITAL_COMMUNITY): Payer: Medicare Other

## 2013-02-19 ENCOUNTER — Telehealth: Payer: Self-pay | Admitting: Internal Medicine

## 2013-02-19 DIAGNOSIS — E119 Type 2 diabetes mellitus without complications: Secondary | ICD-10-CM | POA: Insufficient documentation

## 2013-02-19 DIAGNOSIS — Z8619 Personal history of other infectious and parasitic diseases: Secondary | ICD-10-CM | POA: Insufficient documentation

## 2013-02-19 DIAGNOSIS — E785 Hyperlipidemia, unspecified: Secondary | ICD-10-CM | POA: Insufficient documentation

## 2013-02-19 DIAGNOSIS — T50905A Adverse effect of unspecified drugs, medicaments and biological substances, initial encounter: Secondary | ICD-10-CM

## 2013-02-19 DIAGNOSIS — Z794 Long term (current) use of insulin: Secondary | ICD-10-CM | POA: Insufficient documentation

## 2013-02-19 DIAGNOSIS — R6883 Chills (without fever): Secondary | ICD-10-CM | POA: Insufficient documentation

## 2013-02-19 DIAGNOSIS — R112 Nausea with vomiting, unspecified: Secondary | ICD-10-CM | POA: Insufficient documentation

## 2013-02-19 DIAGNOSIS — Z79899 Other long term (current) drug therapy: Secondary | ICD-10-CM | POA: Insufficient documentation

## 2013-02-19 DIAGNOSIS — F0280 Dementia in other diseases classified elsewhere without behavioral disturbance: Secondary | ICD-10-CM | POA: Insufficient documentation

## 2013-02-19 DIAGNOSIS — Z85528 Personal history of other malignant neoplasm of kidney: Secondary | ICD-10-CM | POA: Insufficient documentation

## 2013-02-19 DIAGNOSIS — G309 Alzheimer's disease, unspecified: Secondary | ICD-10-CM | POA: Insufficient documentation

## 2013-02-19 DIAGNOSIS — R5381 Other malaise: Secondary | ICD-10-CM | POA: Insufficient documentation

## 2013-02-19 DIAGNOSIS — R413 Other amnesia: Secondary | ICD-10-CM | POA: Insufficient documentation

## 2013-02-19 LAB — CBC WITH DIFFERENTIAL/PLATELET
HCT: 43.4 % (ref 39.0–52.0)
Hemoglobin: 15.2 g/dL (ref 13.0–17.0)
Lymphs Abs: 1.6 10*3/uL (ref 0.7–4.0)
Monocytes Relative: 8 % (ref 3–12)
Neutro Abs: 4.2 10*3/uL (ref 1.7–7.7)
Neutrophils Relative %: 64 % (ref 43–77)
RBC: 4.59 MIL/uL (ref 4.22–5.81)

## 2013-02-19 LAB — COMPREHENSIVE METABOLIC PANEL
Alkaline Phosphatase: 90 U/L (ref 39–117)
BUN: 17 mg/dL (ref 6–23)
CO2: 20 mEq/L (ref 19–32)
Chloride: 103 mEq/L (ref 96–112)
GFR calc Af Amer: 57 mL/min — ABNORMAL LOW (ref >90)
GFR calc non Af Amer: 49 mL/min — ABNORMAL LOW (ref >90)
Glucose, Bld: 271 mg/dL — ABNORMAL HIGH (ref 70–99)
Potassium: 4.3 mEq/L (ref 3.5–5.1)
Total Bilirubin: 1.8 mg/dL — ABNORMAL HIGH (ref 0.3–1.2)

## 2013-02-19 LAB — LIPASE, BLOOD: Lipase: 26 U/L (ref 11–59)

## 2013-02-19 LAB — GLUCOSE, CAPILLARY: Glucose-Capillary: 234 mg/dL — ABNORMAL HIGH (ref 70–99)

## 2013-02-19 NOTE — ED Notes (Signed)
Per report from pt.  He states that this am he awoke and began to have severe RUQ pain with nausea and chills.  He states yesterday he had 1 episode of emesis yesterday.  Denies CP.  Resp symmetrical and unlabored.

## 2013-02-19 NOTE — Telephone Encounter (Signed)
Patient Information:  Caller Name: Consuella Lose  Phone: [phone]  Patient: Bryan Ramos, Bryan Ramos  Gender: Male  DOB: August 11, 1935  Age: 77 Years  PCP: Darryll Capers (Adults only)  Office Follow Up:  Does the office need to follow up with this patient?: No  Instructions For The Office: N/A   Symptoms  Reason For Call & Symptoms: Pt has woken up nauseated and vomiting this am and sweating profusely. Onset yesterday afternoon - he vomited x 1 yesterday. Seemed to do ok yesterday and then has vomited up white mucous this am /several times. Pt denies any chest pain or any other pain. Pt is diabetic/and hasn't eaten anything today. He has not had his insulin today. Blood sugar is currently 234.  Reviewed Health History In EMR: Yes  Reviewed Medications In EMR: Yes  Reviewed Allergies In EMR: Yes  Reviewed Surgeries / Procedures: Yes  Date of Onset of Symptoms: 02/18/2013  Guideline(s) Used:  Vomiting  Disposition Per Guideline:   Go to ED Now  Reason For Disposition Reached:   Moderate vomiting (e.g., 3 - 5 times/day) and age > 1  Advice Given:  N/A  Patient Will Follow Care Advice:  YES/Pt will go to [facility]

## 2013-02-19 NOTE — ED Provider Notes (Signed)
CSN: [id number]     Arrival date & time 02/19/13  1006 History     First MD Initiated Contact with Patient 02/19/13 1014     Chief Complaint  Patient presents with  . Abdominal Pain   (Consider location/radiation/quality/duration/timing/severity/associated sxs/prior Treatment) HPI Comments: Patient with past medical history significant for type 2 diabetes, early dementia. He presents today with complaints of weakness, nausea, vomiting, and chills.  His symptoms started approximately 2 days ago with feeling weak and has progressed to his nausea and vomiting that he is experiencing today. He denies to me he is having a chest pain or shortness of breath. Of note is that he was recently started on the Exelon patch 2 days ago as well.  With his history of dementia, the wife states that he sometimes believes that details and he is unable to express exactly how he was feeling.  Patient is a 77 y.o. male presenting with abdominal pain. The history is provided by the patient.  Abdominal Pain Pain location:  Epigastric Pain quality: cramping   Pain radiates to:  Does not radiate Pain severity:  Moderate Onset quality:  Gradual Duration:  2 days Timing:  Constant Progression:  Worsening Chronicity:  New Context: not sick contacts   Relieved by:  Nothing Worsened by:  Nothing tried Ineffective treatments:  None tried Associated symptoms: chills, fatigue, nausea and vomiting   Associated symptoms: no fever     Past Medical History  Diagnosis Date  . Renal cell carcinoma   . Hyperlipidemia   . HSV infection   . Alzheimer's dementia     dr Sandria Manly  . Diabetes mellitus without complication     [facility]  . Memory change    Past Surgical History  Procedure Laterality Date  . Removal of kidney    . Nephrectomy      Dr. Letha Cape  . Tonsillectomy     Family History  Problem Relation Age of Onset  . Stroke Father   . Alzheimer's disease    . Colon cancer Neg Hx   .  Dementia Paternal Grandfather    History  Substance Use Topics  . Smoking status: Never Smoker   . Smokeless tobacco: Never Used  . Alcohol Use: No    Review of Systems  Constitutional: Positive for chills and fatigue. Negative for fever.  Gastrointestinal: Positive for nausea, vomiting and abdominal pain.  All other systems reviewed and are negative.    Allergies  Ciprofloxacin  Home Medications   Current Outpatient Rx  Name  Route  Sig  Dispense  Refill  . B-D ULTRAFINE III SHORT PEN 31G X 8 MM MISC      USE AS DIRECTED WITH LANTUS SOLOSTAR   100 each   11   . clotrimazole-betamethasone (LOTRISONE) cream      APPLY TO RASH in groin area TWICE A DAY   45 g   3   . donepezil (ARICEPT) 23 MG TABS tablet   Oral   Take 23 mg by mouth daily.         Marland Kitchen ezetimibe (ZETIA) 10 MG tablet   Oral   Take 10 mg by mouth daily.         . insulin glargine (LANTUS) 100 UNIT/ML injection      2 (two) times daily. Sliding scale per endocrine         . ondansetron (ZOFRAN) 4 MG tablet   Oral   Take 1 tablet (4 mg total) by  mouth every 8 (eight) hours as needed for nausea.   20 tablet   0   . ONE TOUCH ULTRA TEST test strip      USE AS DIRECTED   100 each   3     DX Code Needed .   . simvastatin (ZOCOR) 40 MG tablet   Oral   Take 40 mg by mouth every evening.         . sitaGLIPtin (JANUVIA) 100 MG tablet   Oral   Take 100 mg by mouth daily.         . Sulfacetamide-Sulfur-Sunscreen (ROSAC) 10-5 % CREA   Apply externally   Apply 1 application topically 2 (two) times daily.   45 g   3   . tadalafil (CIALIS) 5 MG tablet   Oral   Take 5 mg by mouth daily as needed for erectile dysfunction.         . valACYclovir (VALTREX) 500 MG tablet   Oral   Take 1 tablet (500 mg total) by mouth daily.   90 tablet   3    BP 116/85  Pulse 55  Temp(Src) 97.4 F (36.3 C) (Oral)  Resp 18  SpO2 100% Physical Exam  Nursing note and vitals  reviewed. Constitutional: He is oriented to person, place, and time. He appears well-developed and well-nourished. No distress.  HENT:  Head: Normocephalic and atraumatic.  Mouth/Throat: Oropharynx is clear and moist.  Neck: Normal range of motion. Neck supple.  Cardiovascular: Normal rate, regular rhythm and normal heart sounds.   No murmur heard. Pulmonary/Chest: Effort normal and breath sounds normal. No respiratory distress. He has no wheezes.  Abdominal: Soft. Bowel sounds are normal. He exhibits no distension. There is no tenderness.  Musculoskeletal: Normal range of motion. He exhibits no edema.  Lymphadenopathy:    He has no cervical adenopathy.  Neurological: He is alert and oriented to person, place, and time.  Skin: Skin is warm. He is not diaphoretic.     ED Course   Procedures (including critical care time)  Labs Reviewed  GLUCOSE, CAPILLARY - Abnormal; Notable for the following:    Glucose-Capillary 234 (*)    All other components within normal limits  CBC WITH DIFFERENTIAL  COMPREHENSIVE METABOLIC PANEL  LIPASE, BLOOD  TROPONIN I   No results found. No diagnosis found.   Date: 02/19/2013  Rate: 46  Rhythm: sinus bradycardia  QRS Axis: normal  Intervals: normal  ST/T Wave abnormalities: normal  Conduction Disutrbances:none  Narrative Interpretation:   Old EKG Reviewed: none available    MDM  The patient presents here with complaints of nausea vomiting feeling sweaty.  This started yesterday and worsened today. His symptoms seem to coincide with the application of an exelon patch.  I have found nothing else in the workup to indicate another reason for this. His symptoms do not seem cardiac and his EKG and troponin were both unremarkable.  The laboratory studies do not reveal an elevation of white count, his liver functions, or lipase. I doubt a gallbladder problem. I discussed the side effects of the Exelon patch with Dr. Thad Ranger from neurology. She has  informed me there is a high incidence of GI side effects and I suspect that this is the etiology of his complaints. At this point he appears stable for discharge. I discussed this with the wife who will make arrangements for him to followup with his neurologist to discuss alternate medications. She also has informed me that he has  nausea medication at home and we'll give him this for his nausea.   Geoffery Lyons, MD 02/19/13 [phone]

## 2013-02-24 ENCOUNTER — Telehealth: Payer: Self-pay | Admitting: Neurology

## 2013-02-26 ENCOUNTER — Ambulatory Visit (INDEPENDENT_AMBULATORY_CARE_PROVIDER_SITE_OTHER): Payer: Medicare Other | Admitting: Family

## 2013-02-26 ENCOUNTER — Telehealth: Payer: Self-pay | Admitting: Neurology

## 2013-02-26 ENCOUNTER — Encounter: Payer: Self-pay | Admitting: Family

## 2013-02-26 VITALS — BP 110/80 | HR 62 | Wt 184.0 lb

## 2013-02-26 DIAGNOSIS — F0280 Dementia in other diseases classified elsewhere without behavioral disturbance: Secondary | ICD-10-CM

## 2013-02-26 DIAGNOSIS — K219 Gastro-esophageal reflux disease without esophagitis: Secondary | ICD-10-CM

## 2013-02-26 MED ORDER — OMEPRAZOLE 20 MG PO CPDR: 20.0000 mg | DELAYED_RELEASE_CAPSULE | Freq: Every day | ORAL | Status: DC

## 2013-02-26 NOTE — Patient Instructions (Signed)

## 2013-03-01 ENCOUNTER — Other Ambulatory Visit: Payer: Self-pay | Admitting: Neurology

## 2013-03-01 MED ORDER — MEMANTINE HCL 28 X 5 MG & 21 X 10 MG PO TABS: ORAL_TABLET | ORAL | Status: DC

## 2013-03-01 NOTE — Progress Notes (Signed)
Subjective:    Patient ID: Bryan Ramos, male    DOB: 09-18-1935, 77 y.o.   MRN: [id number]  HPI  77 year old white male, nonsmoker, patient of Dr. Lovell Sheehan is in today as an emergency department followup. He was seen in the ED on 02/19/2013 with weakness, nausea, vomiting, increased sweating. He had just began taking the Exelon patch 2 days prior with an overlap Aricept. Since she's been off the patches and all much better. However, his wife's feels he should probably try it again if they're unsure if that was the cause of his sudden symptoms. The symptoms have completely resolved.  Wife has concerns that his husband has hiccups and indigestion frequently. He had an acute episode prior to going to the emergency department.   Review of Systems  Constitutional: Negative.   HENT: Negative.   Respiratory: Negative.   Cardiovascular: Negative.   Gastrointestinal: Negative for nausea, constipation, blood in stool and abdominal distention.       Indigestion  Musculoskeletal: Negative.   Skin: Negative.   Neurological: Negative.   Psychiatric/Behavioral: Negative.    Past Medical History  Diagnosis Date  . Renal cell carcinoma   . Hyperlipidemia   . HSV infection   . Alzheimer's dementia     dr Sandria Manly  . Diabetes mellitus without complication     seeing [facility]  . Memory change     History   Social History  . Marital Status: Single    Spouse Name: Consuella Lose    Number of Children: 0  . Years of Education: college   Occupational History  .      Retired   Social History Main Topics  . Smoking status: Never Smoker   . Smokeless tobacco: Never Used  . Alcohol Use: No  . Drug Use: No  . Sexual Activity: Not on file   Other Topics Concern  . Not on file   Social History Narrative   Patient lives at home with his wife Consuella Lose). Patient is retired. College education. BA.   Right handed.   Caffeine- one cup daily.    Past Surgical History  Procedure Laterality Date   . Removal of kidney    . Nephrectomy      Dr. Letha Cape  . Tonsillectomy      Family History  Problem Relation Age of Onset  . Stroke Father   . Alzheimer's disease    . Colon cancer Neg Hx   . Dementia Paternal Grandfather     Allergies  Allergen Reactions  . Ciprofloxacin     Nausea     Current Outpatient Prescriptions on File Prior to Visit  Medication Sig Dispense Refill  . B-D ULTRAFINE III SHORT PEN 31G X 8 MM MISC USE AS DIRECTED WITH LANTUS SOLOSTAR  100 each  11  . clotrimazole-betamethasone (LOTRISONE) cream APPLY TO RASH in groin area TWICE A DAY  45 g  3  . donepezil (ARICEPT) 23 MG TABS tablet Take 23 mg by mouth daily.      Marland Kitchen ezetimibe (ZETIA) 10 MG tablet Take 10 mg by mouth daily.      . insulin glargine (LANTUS) 100 UNIT/ML injection 2 (two) times daily. Sliding scale per endocrine      . ondansetron (ZOFRAN) 4 MG tablet Take 1 tablet (4 mg total) by mouth every 8 (eight) hours as needed for nausea.  20 tablet  0  . ONE TOUCH ULTRA TEST test strip USE AS DIRECTED  100 each  3  . simvastatin (ZOCOR) 40 MG tablet Take 40 mg by mouth every evening.      . sitaGLIPtin (JANUVIA) 100 MG tablet Take 100 mg by mouth daily.      . Sulfacetamide-Sulfur-Sunscreen (ROSAC) 10-5 % CREA Apply 1 application topically 2 (two) times daily.  45 g  3  . tadalafil (CIALIS) 5 MG tablet Take 5 mg by mouth daily as needed for erectile dysfunction.      . valACYclovir (VALTREX) 500 MG tablet Take 1 tablet (500 mg total) by mouth daily.  90 tablet  3   No current facility-administered medications on file prior to visit.    BP 110/80  Pulse 62  Wt 184 lb (83.462 kg)  BMI 27.98 kg/m2chart    Objective:   Physical Exam  Constitutional: He is oriented to person, place, and time. He appears well-developed and well-nourished.  HENT:  Right Ear: External ear normal.  Left Ear: External ear normal.  Nose: Nose normal.  Mouth/Throat: Oropharynx is clear and moist.  Neck: Normal  range of motion. Neck supple. No thyromegaly present.  Cardiovascular: Normal rate, regular rhythm and normal heart sounds.   Pulmonary/Chest: Effort normal and breath sounds normal.  Abdominal: Soft. Bowel sounds are normal.  Musculoskeletal: Normal range of motion.  Neurological: He is alert and oriented to person, place, and time.  Skin: Skin is warm and dry.  Psychiatric: He has a normal mood and affect.          Assessment & Plan:  Assessment: 1. Dementia 2. Nausea and vomiting-resolved 3. GERD  Plan: Followup neurology is scheduled to see prescribe the medication. Omeprazole 20 mg one tablet daily. GERD friendly diet. Patient from the office with any questions or concerns. Recheck as scheduled, and as needed.

## 2013-03-01 NOTE — Telephone Encounter (Signed)
Please let Bryan Ramos know I placed an order for a different memory medication called Namenda. He will start with a titration pack and the instructions on how to take it will be on the packet. Don't restart the Exelon. Thanks.

## 2013-03-02 NOTE — Telephone Encounter (Signed)
I left a message on the patient's home vm relaying Dr Minus Breeding instructions.  Contact information was given so that he may call back with any further questions or concerns.

## 2013-03-13 ENCOUNTER — Other Ambulatory Visit: Payer: Self-pay | Admitting: Internal Medicine

## 2013-03-23 ENCOUNTER — Telehealth: Payer: Self-pay | Admitting: Neurology

## 2013-03-23 MED ORDER — MEMANTINE HCL ER 28 MG PO CP24: 28.0000 mg | ORAL_CAPSULE | Freq: Every day | ORAL | Status: DC

## 2013-03-23 NOTE — Telephone Encounter (Signed)
Rx sent.  I called the patient.  They are aware.

## 2013-03-30 ENCOUNTER — Other Ambulatory Visit: Payer: Self-pay

## 2013-03-30 MED ORDER — MEMANTINE HCL 10 MG PO TABS: 10.0000 mg | ORAL_TABLET | Freq: Two times a day (BID) | ORAL | Status: DC

## 2013-03-30 NOTE — Telephone Encounter (Signed)
Pharmacy sent [place] a fax asking that we send a Rx for Namenda 10mg  BID since Namenda XR 28mg  is currently on back order.

## 2013-04-15 ENCOUNTER — Other Ambulatory Visit: Payer: Self-pay | Admitting: Urology

## 2013-04-15 DIAGNOSIS — C649 Malignant neoplasm of unspecified kidney, except renal pelvis: Secondary | ICD-10-CM

## 2013-04-16 ENCOUNTER — Encounter: Payer: Self-pay | Admitting: Internal Medicine

## 2013-04-16 ENCOUNTER — Ambulatory Visit (INDEPENDENT_AMBULATORY_CARE_PROVIDER_SITE_OTHER): Payer: Medicare Other | Admitting: Internal Medicine

## 2013-04-16 VITALS — BP 124/76 | HR 72 | Temp 98.6°F | Resp 16 | Ht 68.0 in | Wt 181.0 lb

## 2013-04-16 DIAGNOSIS — K3184 Gastroparesis: Secondary | ICD-10-CM

## 2013-04-16 DIAGNOSIS — E119 Type 2 diabetes mellitus without complications: Secondary | ICD-10-CM

## 2013-04-16 DIAGNOSIS — F0280 Dementia in other diseases classified elsewhere without behavioral disturbance: Secondary | ICD-10-CM

## 2013-04-16 NOTE — Patient Instructions (Addendum)
Soak feet in i cup pf white vinegar in warm water

## 2013-04-16 NOTE — Progress Notes (Signed)
  Subjective:    Patient ID: Bryan Ramos, male    DOB: 02-22-1936, 77 y.o.   MRN: [id number]  HPI The  patient is followed by endocrinology and has had a flu shot with Dr Lurene Shadow The patient reported A1C was improved 6.9   Review of Systems  Constitutional: Negative for fever and fatigue.  HENT: Negative for hearing loss, congestion, neck pain and postnasal drip.   Eyes: Negative for discharge, redness and visual disturbance.  Respiratory: Negative for cough, shortness of breath and wheezing.   Cardiovascular: Negative for leg swelling.  Gastrointestinal: Negative for abdominal pain, constipation and abdominal distention.  Genitourinary: Negative for urgency and frequency.  Musculoskeletal: Negative for joint swelling and arthralgias.  Skin: Negative for color change and rash.  Neurological: Negative for weakness and light-headedness.  Hematological: Negative for adenopathy.  Psychiatric/Behavioral: Negative for behavioral problems.       Objective:   Physical Exam  Constitutional: He appears well-developed and well-nourished.  HENT:  Head: Normocephalic and atraumatic.  Cardiovascular: Normal rate.   Murmur heard. Pulmonary/Chest: Effort normal and breath sounds normal.  Abdominal: Soft. Bowel sounds are normal.          Assessment & Plan:  Patient is on Namenda for memory he was not able to tolerate Exelon due to nausea and vomiting.  He is followed by endocrinology for his diabetes his A1c's have been stable. A1c was improved.  The patient has fungal toenails ASAP for quite some time we discussed the use of white vinegar and warm water soaks.  Otherwise because of his hospital visit where he had a complete metabolic panel as well as a CBC he is up-to-date on all appropriate monitoring for drug conditions

## 2013-05-04 ENCOUNTER — Ambulatory Visit: Payer: Medicare Other | Admitting: Neurology

## 2013-05-06 ENCOUNTER — Ambulatory Visit (HOSPITAL_COMMUNITY)
Admission: RE | Admit: 2013-05-06 | Discharge: 2013-05-06 | Disposition: A | Discharge status: Home / Self Care | Payer: Medicare Other | Source: Ambulatory Visit | Attending: Urology | Admitting: Urology

## 2013-05-06 DIAGNOSIS — N281 Cyst of kidney, acquired: Secondary | ICD-10-CM | POA: Insufficient documentation

## 2013-05-06 DIAGNOSIS — K8020 Calculus of gallbladder without cholecystitis without obstruction: Secondary | ICD-10-CM | POA: Insufficient documentation

## 2013-05-06 DIAGNOSIS — R599 Enlarged lymph nodes, unspecified: Secondary | ICD-10-CM | POA: Insufficient documentation

## 2013-05-06 DIAGNOSIS — Z905 Acquired absence of kidney: Secondary | ICD-10-CM | POA: Insufficient documentation

## 2013-05-06 DIAGNOSIS — C649 Malignant neoplasm of unspecified kidney, except renal pelvis: Secondary | ICD-10-CM | POA: Insufficient documentation

## 2013-05-06 MED ORDER — GADOBENATE DIMEGLUMINE 529 MG/ML IV SOLN
20.0000 mL | Freq: Once | INTRAVENOUS | Status: AC | PRN
  Administered 2013-05-06: 17 mL via INTRAVENOUS

## 2013-05-25 ENCOUNTER — Telehealth: Payer: Self-pay | Admitting: Internal Medicine

## 2013-05-25 NOTE — Telephone Encounter (Signed)
Called pt to schedule np appt vm not able will call back

## 2013-05-29 ENCOUNTER — Other Ambulatory Visit: Payer: Self-pay | Admitting: Internal Medicine

## 2013-06-01 ENCOUNTER — Telehealth: Payer: Self-pay | Admitting: Oncology

## 2013-06-01 NOTE — Telephone Encounter (Signed)
C/D 06/01/13 for appt. 06/11/13

## 2013-06-01 NOTE — Telephone Encounter (Signed)
S/w pt wife and gve np appt 11/28 @ 10:30 w/Dr. Clelia Croft Referring Dr. Retta Diones Dx-Recurrent Renal Cell Carcinoma  Welcome packet mailed.

## 2013-06-04 ENCOUNTER — Other Ambulatory Visit: Payer: Self-pay | Admitting: Oncology

## 2013-06-04 DIAGNOSIS — C649 Malignant neoplasm of unspecified kidney, except renal pelvis: Secondary | ICD-10-CM

## 2013-06-11 ENCOUNTER — Ambulatory Visit: Payer: Medicare Other

## 2013-06-11 ENCOUNTER — Encounter: Payer: Self-pay | Admitting: Oncology

## 2013-06-11 ENCOUNTER — Other Ambulatory Visit (HOSPITAL_BASED_OUTPATIENT_CLINIC_OR_DEPARTMENT_OTHER): Payer: Medicare Other | Admitting: Lab

## 2013-06-11 ENCOUNTER — Telehealth: Payer: Self-pay | Admitting: Oncology

## 2013-06-11 ENCOUNTER — Ambulatory Visit (HOSPITAL_BASED_OUTPATIENT_CLINIC_OR_DEPARTMENT_OTHER): Payer: Medicare Other | Admitting: Oncology

## 2013-06-11 VITALS — BP 145/79 | HR 59 | Temp 97.6°F | Resp 18 | Ht 67.5 in | Wt 183.0 lb

## 2013-06-11 DIAGNOSIS — C649 Malignant neoplasm of unspecified kidney, except renal pelvis: Secondary | ICD-10-CM

## 2013-06-11 LAB — CBC WITH DIFFERENTIAL/PLATELET
Basophils Absolute: 0 10*3/uL (ref 0.0–0.1)
EOS%: 6.1 % (ref 0.0–7.0)
HGB: 14.5 g/dL (ref 13.0–17.1)
MCH: 31.9 pg (ref 27.2–33.4)
MCV: 94.7 fL (ref 79.3–98.0)
MONO%: 8.1 % (ref 0.0–14.0)
RBC: 4.55 10*6/uL (ref 4.20–5.82)
RDW: 12.6 % (ref 11.0–14.6)

## 2013-06-11 LAB — COMPREHENSIVE METABOLIC PANEL (CC13)
AST: 27 U/L (ref 5–34)
Albumin: 3.8 g/dL (ref 3.5–5.0)
Alkaline Phosphatase: 104 U/L (ref 40–150)
BUN: 22.1 mg/dL (ref 7.0–26.0)
Creatinine: 1.6 mg/dL — ABNORMAL HIGH (ref 0.7–1.3)
Potassium: 4.9 mEq/L (ref 3.5–5.1)

## 2013-06-11 NOTE — Progress Notes (Signed)
Please see consult note.  

## 2013-06-11 NOTE — Progress Notes (Signed)
Checked in new patient with no financial issues. He has not been [place] and has appt card.

## 2013-06-11 NOTE — Consult Note (Signed)
Reason for Referral: Kidney cancer.   HPI: This is a 77 year old gentleman referred to me for the evaluation of a recurrent renal cell carcinoma. His history of kidney cancer dates back to 2011 where he had recurrent nephrolithiasis but found to have a left kidney tumor. On 04/11/2010 he underwent a radical nephrectomy with the pathology showed a renal cell carcinoma to the other cell type measuring 5 cm. The margins were not involved. The Fuhrman nuclear grade was 3/4 with the final staging to be pathological stage TIb NX. This was performed by Dr. Lenn Sink and have been followed routinely with imaging studies for surveillance purposes. His most recent CT scan of the abdomen and pelvis done on 04/13/2013 showed an enlargement of a hypodense nodule within the left periaortic region measuring 2.2 cm and previously measuring 1.4 cm over one year ago. There is no other adenopathy or abnormalities noted. An MRI obtained on 05/06/2013 to further quantify these lesions, and confirm the presence of a 2.2 cm retroperitoneal hypervascular node in the left periaortic region. No other abnormalities noted at that time. Patient referred to me for further evaluation.  Clinically, he is completely asymptomatic at this point. He is not reporting any abdominal pain or pelvic pain or hematuria. As I put any headaches blurred vision double vision or neurological changes. He does have problems with memory but no seizure activities. He does not report any fevers chills sweats or weight loss. Does not report any chest pain or difficulty breathing. Is not reporting any cough or hemoptysis or hematemesis. Is not reporting any nausea or vomiting abdominal pain or abdominal distention. Does not report any genitourinary bleeding or frequency urgency or hesitancy. He is not reporting any bleeding or clotting tendencies. Does not report any musculoskeletal complaints. Overall his performance status remained stable and only limited by  his dementia.   Past Medical History  Diagnosis Date  . Renal cell carcinoma   . Hyperlipidemia   . HSV infection   . Alzheimer's dementia     dr Sandria Manly  . Diabetes mellitus without complication     seeing [facility]  . Memory change   :  Past Surgical History  Procedure Laterality Date  . Removal of kidney    . Nephrectomy      Dr. Letha Cape  . Tonsillectomy    :  Current Outpatient Prescriptions  Medication Sig Dispense Refill  . B-D ULTRAFINE III SHORT PEN 31G X 8 MM MISC USE AS DIRECTED WITH LANTUS SOLOSTAR  100 each  11  . clotrimazole-betamethasone (LOTRISONE) cream APPLY TO RASH in groin area TWICE A DAY  45 g  3  . donepezil (ARICEPT) 23 MG TABS tablet Take 23 mg by mouth daily.      Marland Kitchen ezetimibe (ZETIA) 10 MG tablet Take 10 mg by mouth daily.      . insulin glargine (LANTUS) 100 UNIT/ML injection 2 (two) times daily. Sliding scale per endocrine      . memantine (NAMENDA) 10 MG tablet Take 1 tablet (10 mg total) by mouth 2 (two) times daily.  180 tablet  1  . omeprazole (PRILOSEC) 20 MG capsule Take 1 capsule (20 mg total) by mouth daily.  30 capsule  0  . ondansetron (ZOFRAN) 4 MG tablet Take 1 tablet (4 mg total) by mouth every 8 (eight) hours as needed for nausea.  20 tablet  0  . ONE TOUCH ULTRA TEST test strip USE AS DIRECTED  100 each  3  . simvastatin (ZOCOR)  40 MG tablet Take 40 mg by mouth every evening.      . sitaGLIPtin (JANUVIA) 100 MG tablet Take 100 mg by mouth daily.      . Sulfacetamide-Sulfur-Sunscreen (ROSAC) 10-5 % CREA Apply 1 application topically 2 (two) times daily.  45 g  3  . tadalafil (CIALIS) 5 MG tablet Take 5 mg by mouth daily as needed for erectile dysfunction.      . valACYclovir (VALTREX) 500 MG tablet Take 1 tablet (500 mg total) by mouth daily.  90 tablet  3  . VYTORIN 10-40 MG per tablet TAKE 1 TABLET AT BEDTIME  100 tablet  3   No current facility-administered medications for this visit.      Allergies  Allergen Reactions   . Ciprofloxacin     Nausea   . Exelon [Rivastigmine]     patch  :  Family History  Problem Relation Age of Onset  . Stroke Father   . Alzheimer's disease    . Colon cancer Neg Hx   . Dementia Paternal Grandfather   :  History   Social History  . Marital Status: Single    Spouse Name: Consuella Lose    Number of Children: 0  . Years of Education: college   Occupational History  .      Retired   Social History Main Topics  . Smoking status: Never Smoker   . Smokeless tobacco: Never Used  . Alcohol Use: No  . Drug Use: No  . Sexual Activity: Not on file   Other Topics Concern  . Not on file   Social History Narrative   Patient lives at home with his wife Consuella Lose). Patient is retired. College education. BA.   Right handed.   Caffeine- one cup daily.  :  Pertinent items are noted in HPI.  Exam: Blood pressure 145/79, pulse 59, temperature 97.6 F (36.4 C), temperature source Oral, resp. rate 18, height 5' 7.5" (1.715 m), weight 183 lb (83.008 kg). General appearance: alert and cooperative Head: Normocephalic, without obvious abnormality, atraumatic Nose: Nares normal. Septum midline. Mucosa normal. No drainage or sinus tenderness. Throat: lips, mucosa, and tongue normal; teeth and gums normal Neck: no adenopathy, no carotid bruit, no JVD, supple, symmetrical, trachea midline and thyroid not enlarged, symmetric, no tenderness/mass/nodules Back: symmetric, no curvature. ROM normal. No CVA tenderness. Resp: clear to auscultation bilaterally Chest wall: no tenderness Cardio: regular rate and rhythm, S1, S2 normal, no murmur, click, rub or gallop GI: soft, non-tender; bowel sounds normal; no masses,  no organomegaly Extremities: extremities normal, atraumatic, no cyanosis or edema Pulses: 2+ and symmetric Skin: Skin color, texture, turgor normal. No rashes or lesions   Recent Labs  06/11/13 1045  WBC 5.4  HGB 14.5  HCT 43.1  PLT 194     Assessment and Plan:    77 year old gentleman with history of kidney cancer and likely relapse disease. His initial diagnosis was in 2011 where he had a 5 cm left kidney tumor. He is status post left nephrectomy with the pathology revealed a renal cell carcinoma, clear cell type with Fuhrman grade 3/4 and the pathological staging was T1b Nx. He has been on active surveillance and most recently was noted to have a periaortic enlargement suggesting relapse disease. On his most recent imaging studies that lymph node measuring 2.2 cm compared to 1.4 cm a year ago. The natural course of this disease in general was discussed today with the patient as well as the possibility of this  enlarging lymph node. I explained to him that I do agree with Dr. Lenn Sink that this will likely represents recurrent disease but pathological confirmation will be needed before any treatment. I see low yield at this time with a biopsy or surgical exploration given the small size and location of this lymph node. I have explained to him that even if we document metastatic disease in that area I see no immediate need to start anticancer therapy. Given his age and comorbid conditions and the small nature of his recurrence it is reasonable to give a period of observation and surveillance to get an idea of the pace of his disease. If his disease can recur and spread rapidly, then we will consider treatment at that time. If his disease continues to be indolent like it looks like it is then continued observation will be warranted.  Plan I my recommendation at this point that he continues active surveillance with imaging studies with Dr. Lenn Sink as scheduled. And I will continue to review the scans as well and if his disease developed rapid progression or new areas of concern we will consider treatment at that time. He understands the plan and prefers to have more conservative approach which I think is very reasonable in his case. All his questions were answered  today.  I will set him up a followup in 6 months time.

## 2013-06-11 NOTE — Progress Notes (Signed)
Mailed updated medication list to patient's home 

## 2013-06-11 NOTE — Telephone Encounter (Signed)
Gave pt appt for MD only  °

## 2013-06-17 ENCOUNTER — Encounter: Payer: Self-pay | Admitting: Neurology

## 2013-06-17 ENCOUNTER — Encounter (INDEPENDENT_AMBULATORY_CARE_PROVIDER_SITE_OTHER): Payer: Self-pay

## 2013-06-17 ENCOUNTER — Ambulatory Visit (INDEPENDENT_AMBULATORY_CARE_PROVIDER_SITE_OTHER): Payer: Medicare Other | Admitting: Neurology

## 2013-06-17 VITALS — BP 133/82 | HR 61 | Ht 70.0 in | Wt 181.0 lb

## 2013-06-17 DIAGNOSIS — R4189 Other symptoms and signs involving cognitive functions and awareness: Secondary | ICD-10-CM

## 2013-06-17 DIAGNOSIS — F09 Unspecified mental disorder due to known physiological condition: Secondary | ICD-10-CM

## 2013-06-17 MED ORDER — DONEPEZIL HCL 5 MG PO TABS: 5.0000 mg | ORAL_TABLET | Freq: Every day | ORAL | Status: DC

## 2013-06-17 NOTE — Progress Notes (Signed)
HPI: Bryan Ramos is a pleasant 77y/o gentleman with a history of slowly progressive cognitive decline presenting for follow up visit, with his last appointment being on 01/2013. At that time his wife had concern over worsening cognitive function and he was switched from Aricept to Exelon 9.5mg . Was unable to tolerate Exelon and is currently on Namenda 10mg  twice a day. Per wife, the biggest concern is difficulty with word finding, trouble thinking of names of things. He continues to drive, denies any accidents, reports getting lost a few times driving. Wife feels that he is still safe driving.   Tolerating the Namenda well.  They have not noted much benefit from starting it.     Prior visit with Dr Sandria Manly [id number]/6750: 77 year old right-handed white  married  male with a 5 yr history of progressive memory loss and family history of memory loss evaluated with an MRI study of the brain, TSH, B12,and RPR. I  thought there was hippocampal atrophy on the MRI of the brain. He is on Aricept 10 mg daily. He was  first seen 03/27/09  with MMSE 23/30, clock drawing task 4/4, 16 four legged animals in one minute. Repeat evaluation 05/29/09 with MMSE 26/30, clock drawing test 3/4, and 12 four legged animals in one minute. Evaluation 04/03/11 with MMSE 24/30, clock drawing task 4/4, and animal fluency 11. Evaluation 09/17/2011 MMSE 26/30. CDT 4/4. AFT 16. He is independent with activities of daily living. He lives with his wife, married for 1-1/2 years.   He exercises on a regular basis. He uses a brace on his left leg and has restarted playing basketball for one and a  half hours at a time, Mondays, Wednesdays, and Fridays.. He has left lateral knee numbness without significant pain.There is no history of drug or alcohol use. He denies depression, falls, hallucinations or bowel or bladder incontinence. He is sleeping well.He has no falls. He denies depression. He denies problems driving. is not playing card games use a computeror  perform memory games. His socialization is traveling to [place] to see his wife. She states he doesn't talk much improved.He checks his capillary blood glucoses twice per day. 05/21/2012= MMSE 22/30. Clock drawing task 4/4. Animal fluency test 13. Myrtis Ser index of independence in activities of daily living 6. Lawton-Brody instrumental activities of daily living scale 6. Neuropsychiatric inventory= irritability 4. Geriatric depression scale 0/15. Falls assessment tool score 2.   ROS: Constitutional: Denies fever, weight loss, weight gain Eyes: Denies blurry vision, loss of vision, eye pain CV: Denies chest pain, palpitations, syncope Pulm: Denies SOB, dyspnea, cough GI:  Denies constipation, diarrhea, abdominal pain MSK: Denies spasms, muscle pain, weakness Neuro:+ memory loss Denies HA, vertigo, falls, tremor Psyc:  Denies depression, hallucinatons, confusion Hem/lymph: Denies easy bleeding, bruising, no swollen nodes Allergic: +runny nose, hives, rashes  All other ROS are negative     Exam: Gen: NAD, conversant Eyes: anicteric sclerae, moist conjunctivae HENT: Atraumati Neck: Trachea midline; supple,  Lungs: CTA, no wheezing, rales, rhonic                          CV: RRR, no MRG Abdomen: Soft, non-tender;  Extremities: No peripheral edema  Skin: Normal temperature, no rash,  Psych: Appropriate affect, pleasant  Neuro: MS: see MMSE score of 10/30, declined from  17/30 on 01/2013 and 22/30 on 05/2012  CN: PERRL, EOMI no nystagmus, no ptosis, sensation intact to LT V1-V3 bilat, face symmetric, no weakness, hearing  grossly intact, palate elevates symmetrically, shoulder shrug 5/5 bilat,  tongue protrudes midline, no fasiculations noted.  Motor: normal bulk and tone Strength: 5/5  In all extremities  Coord: rapid alternating and point-to-point (FNF, HTS) movements intact.  Reflexes: symmetrical, bilat downgoing toes  Sens: LT intact in all  extremities  Gait: posture, stance, stride and arm-swing normal. Tandem gait intact. Able to walk on heels and toes. Romberg absent.    Assessment/Plan:   Bryan Ramos is a pleasant 77y/o gentleman with a chronic progressive decline in his cognitive function most consistent with a diagnosis of Alzheimers type dementia. He returns today for follow up with his wife, who notes concern that he has not had much benefit since starting Namenda. MOCA notable for decline down to 10/30 from 17/30.  1) Cognitive decline -most consistent with a diagnosis of Alzheimers type dementia -Continue Namenda -will restart Aricept 5mg  daily, can titrate up as tolerated -will follow up in 4 months or early as needed  A total of 30 minutes was spent in with this patient. Over half this time was spent on counseling patient on the diagnosis and different therapeutic options available. Specifically time was spent discussing driving safety, progression of the disease and medication alternatives.

## 2013-06-17 NOTE — Patient Instructions (Signed)
Overall you are doing fairly well but I do want to suggest a few things today:   Remember to drink plenty of fluid, eat healthy meals and do not skip any meals. Try to eat protein with a every meal and eat a healthy snack such as fruit or nuts in between meals. Try to keep a regular sleep-wake schedule and try to exercise daily, particularly in the form of walking, 20-30 minutes a day, if you can.   As far as your medications are concerned, I would like to suggest restarting the Aricept. Please start by taking 5mg  nightly. Continue to take your Namenda as scheduled.   I would like to see you back in 4 months, sooner if we need to. Please call us with any interim questions, concerns, problems, updates or refill requests.   My clinical assistant and will answer any of your questions and relay your messages to me and also relay most of my messages to you.   Our phone number is [phone]. We also have an after hours call service for urgent matters and there is a physician on-call for urgent questions. For any emergencies you know to call 911 or go to the nearest emergency room

## 2013-07-02 ENCOUNTER — Encounter: Payer: Self-pay | Admitting: Family

## 2013-07-02 ENCOUNTER — Ambulatory Visit (INDEPENDENT_AMBULATORY_CARE_PROVIDER_SITE_OTHER): Payer: Medicare Other | Admitting: Family

## 2013-07-02 VITALS — BP 158/98 | HR 69 | Wt 180.0 lb

## 2013-07-02 DIAGNOSIS — F0390 Unspecified dementia without behavioral disturbance: Secondary | ICD-10-CM

## 2013-07-02 DIAGNOSIS — A088 Other specified intestinal infections: Secondary | ICD-10-CM

## 2013-07-02 DIAGNOSIS — A084 Viral intestinal infection, unspecified: Secondary | ICD-10-CM

## 2013-07-02 DIAGNOSIS — R112 Nausea with vomiting, unspecified: Secondary | ICD-10-CM

## 2013-07-02 NOTE — Patient Instructions (Signed)
Viral Gastroenteritis Viral gastroenteritis is also known as stomach flu. This condition affects the stomach and intestinal tract. It can cause sudden diarrhea and vomiting. The illness typically lasts 3 to 8 days. Most people develop an immune response that eventually gets rid of the virus. While this natural response develops, the virus can make you quite ill. CAUSES  Many different viruses can cause gastroenteritis, such as rotavirus or noroviruses. You can catch one of these viruses by consuming contaminated food or water. You may also catch a virus by sharing utensils or other personal items with an infected person or by touching a contaminated surface. SYMPTOMS  The most common symptoms are diarrhea and vomiting. These problems can cause a severe loss of body fluids (dehydration) and a body salt (electrolyte) imbalance. Other symptoms may include:  Fever.  Headache.  Fatigue.  Abdominal pain. DIAGNOSIS  Your caregiver can usually diagnose viral gastroenteritis based on your symptoms and a physical exam. A stool sample may also be taken to test for the presence of viruses or other infections. TREATMENT  This illness typically goes away on its own. Treatments are aimed at rehydration. The most serious cases of viral gastroenteritis involve vomiting so severely that you are not able to keep fluids down. In these cases, fluids must be given through an intravenous line (IV). HOME CARE INSTRUCTIONS   Drink enough fluids to keep your urine clear or pale yellow. Drink small amounts of fluids frequently and increase the amounts as tolerated.  Ask your caregiver for specific rehydration instructions.  Avoid:  Foods high in sugar.  Alcohol.  Carbonated drinks.  Tobacco.  Juice.  Caffeine drinks.  Extremely hot or cold fluids.  Fatty, greasy foods.  Too much intake of anything at one time.  Dairy products until 24 to 48 hours after diarrhea stops.  You may consume probiotics.  Probiotics are active cultures of beneficial bacteria. They may lessen the amount and number of diarrheal stools in adults. Probiotics can be found in yogurt with active cultures and in supplements.  Wash your hands well to avoid spreading the virus.  Only take over-the-counter or prescription medicines for pain, discomfort, or fever as directed by your caregiver. Do not give aspirin to children. Antidiarrheal medicines are not recommended.  Ask your caregiver if you should continue to take your regular prescribed and over-the-counter medicines.  Keep all follow-up appointments as directed by your caregiver. SEEK IMMEDIATE MEDICAL CARE IF:   You are unable to keep fluids down.  You do not urinate at least once every 6 to 8 hours.  You develop shortness of breath.  You notice blood in your stool or vomit. This may look like coffee grounds.  You have abdominal pain that increases or is concentrated in one small area (localized).  You have persistent vomiting or diarrhea.  You have a fever.  The patient is a child younger than 3 months, and he or she has a fever.  The patient is a child older than 3 months, and he or she has a fever and persistent symptoms.  The patient is a child older than 3 months, and he or she has a fever and symptoms suddenly get worse.  The patient is a baby, and he or she has no tears when crying. MAKE SURE YOU:   Understand these instructions.  Will watch your condition.  Will get help right away if you are not doing well or get worse. Document Released: 07/01/2005 Document Revised: 09/23/2011 Document Reviewed: 04/17/2011   [employer] Patient Information 2014 [employer]  

## 2013-07-02 NOTE — Progress Notes (Signed)
Subjective:    Patient ID: Bryan Ramos, male    DOB: 1935-10-28, 77 y.o.   MRN: [id number]  HPI 77 year old, WM, nonsmoker, patient of Dr. Lovell Sheehan is in today with complaints of nausea, vomiting, diarrhea that began 4 days ago. The symptoms are beginning to improve now. He's been using Phenergan suppositories have helped. Patient started Aricept December 2 in conjunction with Namenda for worsening dementia. He's been tolerating the medication well.   Review of Systems  Constitutional: Negative.   HENT: Negative for congestion.   Respiratory: Negative.   Cardiovascular: Negative.   Gastrointestinal: Positive for nausea, vomiting and diarrhea.  Endocrine: Negative.   Genitourinary: Negative.   Musculoskeletal: Negative.  Negative for neck pain and neck stiffness.  Skin: Negative.   Neurological: Negative for dizziness.  Hematological: Negative.   Psychiatric/Behavioral: Negative.    Past Medical History  Diagnosis Date  . Renal cell carcinoma   . Hyperlipidemia   . HSV infection   . Alzheimer's dementia     dr Sandria Manly  . Diabetes mellitus without complication     seeing [facility]  . Memory change     History   Social History  . Marital Status: Single    Spouse Name: Consuella Lose    Number of Children: 0  . Years of Education: college   Occupational History  .      Retired   Social History Main Topics  . Smoking status: Never Smoker   . Smokeless tobacco: Never Used  . Alcohol Use: No  . Drug Use: No  . Sexual Activity: Not on file   Other Topics Concern  . Not on file   Social History Narrative   Patient lives at home with his wife Consuella Lose). Patient is retired. College education. BA.   Right handed.   Caffeine- one cup daily.    Past Surgical History  Procedure Laterality Date  . Removal of kidney    . Nephrectomy      Dr. Letha Cape  . Tonsillectomy      Family History  Problem Relation Age of Onset  . Stroke Father   . Alzheimer's disease      . Colon cancer Neg Hx   . Dementia Paternal Grandfather     Allergies  Allergen Reactions  . Ciprofloxacin     Nausea     Current Outpatient Prescriptions on File Prior to Visit  Medication Sig Dispense Refill  . aspirin 81 MG tablet Take 81 mg by mouth daily.      Marland Kitchen donepezil (ARICEPT) 5 MG tablet Take 1 tablet (5 mg total) by mouth at bedtime.  90 tablet  3  . insulin glargine (LANTUS) 100 UNIT/ML injection 2 (two) times daily. Sliding scale per endocrine      . memantine (NAMENDA) 10 MG tablet Take 1 tablet (10 mg total) by mouth 2 (two) times daily.  180 tablet  1  . Omega-3 Fatty Acids (OMEGA 3 PO) Take 1,200 mg by mouth daily.      Marland Kitchen omeprazole (PRILOSEC) 20 MG capsule Take 1 capsule (20 mg total) by mouth daily.  30 capsule  0  . ondansetron (ZOFRAN) 4 MG tablet Take 1 tablet (4 mg total) by mouth every 8 (eight) hours as needed for nausea.  20 tablet  0  . ONE TOUCH ULTRA TEST test strip USE AS DIRECTED  100 each  3  . sildenafil (VIAGRA) 25 MG tablet Take 25 mg by mouth daily as needed for  erectile dysfunction.      . sitaGLIPtin (JANUVIA) 100 MG tablet Take 100 mg by mouth daily.      . Sulfacetamide-Sulfur-Sunscreen (ROSAC) 10-5 % CREA Apply 1 application topically 2 (two) times daily.  45 g  3  . valACYclovir (VALTREX) 500 MG tablet Take 1 tablet (500 mg total) by mouth daily.  90 tablet  3  . VYTORIN 10-40 MG per tablet TAKE 1 TABLET AT BEDTIME  100 tablet  3   No current facility-administered medications on file prior to visit.    BP 158/98  Pulse 69  Wt 180 lb (81.647 kg)chart    Objective:   Physical Exam  Constitutional: He is oriented to person, place, and time. He appears well-developed and well-nourished.  HENT:  Right Ear: External ear normal.  Left Ear: External ear normal.  Nose: Nose normal.  Mouth/Throat: Oropharynx is clear and moist.  Neck: Normal range of motion. Neck supple.  Cardiovascular: Normal rate, regular rhythm and normal heart sounds.    Pulmonary/Chest: Effort normal and breath sounds normal.  Abdominal: Soft. Bowel sounds are normal.  Musculoskeletal: Normal range of motion.  Tenderness to palpation of the right posterior neck extending to the right shoulder. Worse with flexion.  Neurological: He is alert and oriented to person, place, and time.  Skin: Skin is warm and dry.  Psychiatric: He has a normal mood and affect.          Assessment & Plan:  Assessment: 1. Viral gastroenteritis 2. Nausea and vomiting 3. Dementia  Plan: Increase fluid intake. Rest. Call the office if symptoms worsen or persist. Recheck as scheduled, and as needed.

## 2013-07-19 ENCOUNTER — Other Ambulatory Visit: Payer: Self-pay | Admitting: Family

## 2013-08-20 ENCOUNTER — Other Ambulatory Visit: Payer: Self-pay | Admitting: Neurology

## 2013-09-03 NOTE — Telephone Encounter (Signed)
Pt came in for his visit, closing encounter °

## 2013-09-14 NOTE — Telephone Encounter (Signed)
Needs RX for Namenda sent

## 2013-09-29 ENCOUNTER — Other Ambulatory Visit: Payer: Self-pay | Admitting: Internal Medicine

## 2013-10-19 ENCOUNTER — Telehealth: Payer: Self-pay | Admitting: Neurology

## 2013-10-19 ENCOUNTER — Ambulatory Visit (INDEPENDENT_AMBULATORY_CARE_PROVIDER_SITE_OTHER): Payer: Medicare Other | Admitting: Neurology

## 2013-10-19 ENCOUNTER — Encounter: Payer: Self-pay | Admitting: Neurology

## 2013-10-19 VITALS — BP 164/91 | HR 65 | Ht 70.0 in | Wt 184.0 lb

## 2013-10-19 DIAGNOSIS — R4189 Other symptoms and signs involving cognitive functions and awareness: Secondary | ICD-10-CM

## 2013-10-19 DIAGNOSIS — F09 Unspecified mental disorder due to known physiological condition: Secondary | ICD-10-CM

## 2013-10-19 NOTE — Telephone Encounter (Signed)
Patient's wife at check out today states that she would like to discuss with Dr. Janann Colonel some information about patient, please call her back at the number listed. She wishes to speak with Dr. Janann Colonel privately without patient being present.

## 2013-10-19 NOTE — Telephone Encounter (Signed)
Closing encounter

## 2013-10-19 NOTE — Patient Instructions (Signed)
Overall you are doing fairly well but I do want to suggest a few things today:   Remember to drink plenty of fluid, eat healthy meals and do not skip any meals. Try to eat protein with a every meal and eat a healthy snack such as fruit or nuts in between meals. Try to keep a regular sleep-wake schedule and try to exercise daily, particularly in the form of walking, 20-30 minutes a day, if you can.   As far as your medications are concerned, I would like to suggest the following: 1)Please continue the Namenda at its current dose and schedule 2) Please start taking Aricept 5mg  daily, try taking it at lunch time, after you eat to see if that reduces nausea  Please call me in 2 weeks to update me on how the change is working for you  Follow up in 6 months. Please call us with any interim questions, concerns, problems, updates or refill requests.   My clinical assistant and will answer any of your questions and relay your messages to me and also relay most of my messages to you.   Our phone number is [phone]. We also have an after hours call service for urgent matters and there is a physician on-call for urgent questions. For any emergencies you know to call 911 or go to the nearest emergency room

## 2013-10-19 NOTE — Telephone Encounter (Signed)
Spoke to spouse Margaretha Sheffield. She is requesting to speak with Dr. Janann Colonel tomorrow (10/20/13) between the hours of 10:30am and 3:00pm to discuss patient's behavior without him being present.

## 2013-10-19 NOTE — Progress Notes (Signed)
[facility]    Provider:  Dr Bryan Ramos Referring Provider: Ricard Dillon, MD Primary Care Physician:  Bryan Haber, MD  CC:  Cognitive decline  HPI:  Bryan Ramos is a 78 y.o. male here as a referral from Dr. Arnoldo Ramos for cognitive decline. Last visit was 06/2013 at which time he was started on Aricept 5mg  in addition to Bricelyn. States he has had nausea every time he starts taking Aricept. They feel that the memory is about the same, wife feels slightly worse, slightly worse short term memory. He continues to drive, no difficulties, wife feels he is fine driving. He had one episode of getting lost but otherwise she feels he is doing well. No gait instability, no trips or falls.    Prior visit with Dr Bryan Ramos 29/4661: 78 year old right-handed white  married  male with a 5 yr history of progressive memory loss and family history of memory loss evaluated with an MRI study of the brain, TSH, B12,and RPR. I  thought there was hippocampal atrophy on the MRI of the brain. He is on Aricept 10 mg daily. He was  first seen 03/27/09  with MMSE 23/30, clock drawing task 4/4, 16 four legged animals in one minute. Repeat evaluation 05/29/09 with MMSE 26/30, clock drawing test 3/4, and 12 four legged animals in one minute. Evaluation 04/03/11 with MMSE 24/30, clock drawing task 4/4, and animal fluency 11. Evaluation 09/17/2011 MMSE 26/30. CDT 4/4. AFT 16. He is independent with activities of daily living. He lives with his wife, married for 1-1/2 years.   He exercises on a regular basis. He uses a brace on his left leg and has restarted playing basketball for one and a  half hours at a time, Mondays, Wednesdays, and Fridays.. He has left lateral knee numbness without significant pain.There is no history of drug or alcohol use. He denies depression, falls, hallucinations or bowel or bladder incontinence. He is sleeping well.He has no falls. He denies depression. He denies problems driving. is not  playing card games use a computeror perform memory games. His socialization is traveling to [facility] to see his wife. She states he doesn't talk much improved.He checks his capillary blood glucoses twice per day. 05/21/2012= MMSE 22/30. Clock drawing task 4/4. Animal fluency test 13. Bryan Ramos index of independence in activities of daily living 6. Lawton-Brody instrumental activities of daily living scale 6. Neuropsychiatric inventory= irritability 4. Geriatric depression scale 0/15. Falls assessment tool score 2.  Review of Systems: Out of a complete 14 system review, the patient complains of only the following symptoms, and all other reviewed systems are negative. + cognitive decline  History   Social History  . Marital Status: Single    Spouse Name: Bryan Ramos    Number of Children: 0  . Years of Education: college   Occupational History  .      Retired   Social History Main Topics  . Smoking status: Never Smoker   . Smokeless tobacco: Never Used  . Alcohol Use: No  . Drug Use: No  . Sexual Activity: Not on file   Other Topics Concern  . Not on file   Social History Narrative   Patient lives at home with his wife Bryan Ramos). Patient is retired. College education. BA.   Right handed.   Caffeine- one cup daily.    Family History  Problem Relation Age of Onset  . Stroke Father   . Alzheimer's disease    . Colon cancer Neg  Hx   . Dementia Paternal Grandfather     Past Medical History  Diagnosis Date  . Renal cell carcinoma   . Hyperlipidemia   . HSV infection   . Alzheimer's dementia     dr Bryan Ramos  . Diabetes mellitus without complication     [facility]  . Memory change     Past Surgical History  Procedure Laterality Date  . Removal of kidney    . Nephrectomy      Dr. Mar Ramos  . Tonsillectomy      Current Outpatient Prescriptions  Medication Sig Dispense Refill  . aspirin 81 MG tablet Take 81 mg by mouth daily.      Marland Kitchen donepezil  (ARICEPT) 5 MG tablet Take 1 tablet (5 mg total) by mouth at bedtime.  90 tablet  3  . insulin lispro protamine-lispro (HUMALOG 75/25 MIX) (75-25) 100 UNIT/ML SUSP injection Inject into the skin.      Marland Kitchen NAMENDA 10 MG tablet TAKE 1 TABLET TWICE A DAY  180 tablet  1  . Omega-3 Fatty Acids (OMEGA 3 PO) Take 1,200 mg by mouth daily.      Marland Kitchen omeprazole (PRILOSEC) 20 MG capsule TAKE 1 CAPSULE DAILY  90 capsule  1  . ondansetron (ZOFRAN) 4 MG tablet Take 1 tablet (4 mg total) by mouth every 8 (eight) hours as needed for nausea.  20 tablet  0  . ONE TOUCH ULTRA TEST test strip USE AS DIRECTED  100 each  3  . sildenafil (VIAGRA) 25 MG tablet Take 25 mg by mouth daily as needed for erectile dysfunction.      . sitaGLIPtin (JANUVIA) 100 MG tablet Take 100 mg by mouth daily.      . Sulfacetamide-Sulfur-Sunscreen (ROSAC) 10-5 % CREA Apply 1 application topically 2 (two) times daily.  45 g  3  . valACYclovir (VALTREX) 500 MG tablet Take 1 tablet (500 mg total) by mouth daily.  90 tablet  3  . VYTORIN 10-40 MG per tablet TAKE 1 TABLET AT BEDTIME  100 tablet  3   No current facility-administered medications for this visit.    Allergies as of 10/19/2013 - Review Complete 10/19/2013  Allergen Reaction Noted  . Ciprofloxacin  02/01/2013    Vitals: BP 164/91  Pulse 65  Ht 5\' 10"  (1.778 m)  Wt 184 lb (83.462 kg)  BMI 26.40 kg/m2 Last Weight:  Wt Readings from Last 1 Encounters:  10/19/13 184 lb (83.462 kg)   Last Height:   Ht Readings from Last 1 Encounters:  10/19/13 5\' 10"  (1.778 m)    Physical exam:   Exam: Gen: NAD, conversant Eyes: anicteric sclerae, moist conjunctivae HENT: Atraumatic Neck: Trachea midline; supple,  Lungs: CTA, no wheezing, rales, rhonic                          CV: RRR, no MRG Abdomen: Soft, non-tender;  Extremities: No peripheral edema  Skin: Normal temperature, no rash,  Psych: Appropriate affect, pleasant  Neuro: MS: MOCA 6/30 compared to MMSE score of 10/30 on  06/2013, declined from  17/30 on 01/2013 and 22/30 on 05/2012  CN: PERRL, EOMI no nystagmus, no ptosis, sensation intact to LT V1-V3 bilat, face symmetric, no weakness, hearing grossly intact, palate elevates symmetrically, shoulder shrug 5/5 bilat,  tongue protrudes midline, no fasiculations noted.  Motor: normal bulk and tone Strength: 5/5  In all extremities  Coord: rapid alternating and point-to-point (FNF, HTS) movements intact.  Reflexes: symmetrical, bilat downgoing toes  Sens: LT intact in all extremities  Gait: posture, stance, stride and arm-swing normal. Tandem gait intact. Able to walk on heels and toes. Romberg absent.    Assessment/Plan:   Mr Posadas is a pleasant 77y/o gentleman with a chronic progressive decline in his cognitive function most consistent with a diagnosis of Alzheimers type dementia. He returns today for follow up with his wife, who notes concern that he is unable to tolerate the Aricept and Namenda at the same time.   -Continue Namenda -Will take Aricept 5mg  daily but take separate from [place] to see if any improvement -will call to update on her status -will follow up in 6 months or early as needed   Jim Like, [facility] [address] [place], [place] [address]  Phone [phone] Fax [phone]

## 2013-11-11 ENCOUNTER — Telehealth: Payer: Self-pay | Admitting: Oncology

## 2013-11-11 NOTE — Telephone Encounter (Signed)
FS out 5/28. appt moved to 6/2. s/w both pt and his wife.

## 2013-11-17 ENCOUNTER — Telehealth: Payer: Self-pay | Admitting: Internal Medicine

## 2013-11-17 ENCOUNTER — Encounter: Payer: Medicare Other | Admitting: Internal Medicine

## 2013-11-17 DIAGNOSIS — B009 Herpesviral infection, unspecified: Secondary | ICD-10-CM

## 2013-11-17 DIAGNOSIS — R972 Elevated prostate specific antigen [PSA]: Secondary | ICD-10-CM

## 2013-11-17 DIAGNOSIS — E785 Hyperlipidemia, unspecified: Secondary | ICD-10-CM

## 2013-11-17 DIAGNOSIS — E119 Type 2 diabetes mellitus without complications: Secondary | ICD-10-CM

## 2013-11-17 NOTE — Telephone Encounter (Signed)
Due to provider no longer working mornings, pt needs medicare labs prior to CPX on 12/03/13 at 1:00 pm.  Please order appropriate labs for patient. I will contact patient once labs have been ordered. Thanks.

## 2013-11-18 NOTE — Telephone Encounter (Signed)
Labs order placed.

## 2013-11-19 NOTE — Telephone Encounter (Signed)
lmom for patient to return call in order to schedule labs that have been ordered for his cpx.  Pt needs to schedule labs prior to cpx.

## 2013-12-02 ENCOUNTER — Other Ambulatory Visit (INDEPENDENT_AMBULATORY_CARE_PROVIDER_SITE_OTHER): Payer: Medicare Other

## 2013-12-02 DIAGNOSIS — E119 Type 2 diabetes mellitus without complications: Secondary | ICD-10-CM

## 2013-12-02 DIAGNOSIS — R972 Elevated prostate specific antigen [PSA]: Secondary | ICD-10-CM

## 2013-12-02 DIAGNOSIS — E785 Hyperlipidemia, unspecified: Secondary | ICD-10-CM

## 2013-12-02 LAB — CBC WITH DIFFERENTIAL/PLATELET
BASOS ABS: 0 10*3/uL (ref 0.0–0.1)
BASOS PCT: 0.8 % (ref 0.0–3.0)
Eosinophils Absolute: 0.3 10*3/uL (ref 0.0–0.7)
Eosinophils Relative: 4.8 % (ref 0.0–5.0)
HCT: 42.1 % (ref 39.0–52.0)
HEMOGLOBIN: 14.1 g/dL (ref 13.0–17.0)
Lymphocytes Relative: 25.7 % (ref 12.0–46.0)
Lymphs Abs: 1.5 10*3/uL (ref 0.7–4.0)
MCHC: 33.5 g/dL (ref 30.0–36.0)
MCV: 96.1 fl (ref 78.0–100.0)
MONOS PCT: 7.6 % (ref 3.0–12.0)
Monocytes Absolute: 0.4 10*3/uL (ref 0.1–1.0)
NEUTROS ABS: 3.5 10*3/uL (ref 1.4–7.7)
Neutrophils Relative %: 61.1 % (ref 43.0–77.0)
Platelets: 204 10*3/uL (ref 150.0–400.0)
RBC: 4.38 Mil/uL (ref 4.22–5.81)
RDW: 13.8 % (ref 11.5–15.5)
WBC: 5.7 10*3/uL (ref 4.0–10.5)

## 2013-12-02 LAB — MICROALBUMIN / CREATININE URINE RATIO
Creatinine,U: 126.8 mg/dL
Microalb Creat Ratio: 1.7 mg/g (ref 0.0–30.0)
Microalb, Ur: 2.1 mg/dL — ABNORMAL HIGH (ref 0.0–1.9)

## 2013-12-02 LAB — TSH: TSH: 2.14 u[IU]/mL (ref 0.35–4.50)

## 2013-12-02 LAB — HEPATIC FUNCTION PANEL
ALBUMIN: 3.7 g/dL (ref 3.5–5.2)
ALT: 19 U/L (ref 0–53)
AST: 25 U/L (ref 0–37)
Alkaline Phosphatase: 90 U/L (ref 39–117)
Bilirubin, Direct: 0.3 mg/dL (ref 0.0–0.3)
Total Bilirubin: 1.7 mg/dL — ABNORMAL HIGH (ref 0.2–1.2)
Total Protein: 6.7 g/dL (ref 6.0–8.3)

## 2013-12-02 LAB — BASIC METABOLIC PANEL
BUN: 19 mg/dL (ref 6–23)
CHLORIDE: 107 meq/L (ref 96–112)
CO2: 26 meq/L (ref 19–32)
Calcium: 9.2 mg/dL (ref 8.4–10.5)
Creatinine, Ser: 1.4 mg/dL (ref 0.4–1.5)
GFR: 50.7 mL/min — ABNORMAL LOW (ref >60.00)
Glucose, Bld: 210 mg/dL — ABNORMAL HIGH (ref 70–99)
Potassium: 4.5 mEq/L (ref 3.5–5.1)
SODIUM: 139 meq/L (ref 135–145)

## 2013-12-02 LAB — LIPID PANEL
CHOL/HDL RATIO: 3
Cholesterol: 149 mg/dL (ref 0–200)
HDL: 46.2 mg/dL (ref >39.00)
LDL CALC: 88 mg/dL (ref 0–99)
Triglycerides: 74 mg/dL (ref 0.0–149.0)
VLDL: 14.8 mg/dL (ref 0.0–40.0)

## 2013-12-02 LAB — HEMOGLOBIN A1C: Hgb A1c MFr Bld: 8.5 % — ABNORMAL HIGH (ref 4.6–6.5)

## 2013-12-02 LAB — PSA: PSA: 2.41 ng/mL (ref 0.10–4.00)

## 2013-12-03 ENCOUNTER — Ambulatory Visit (INDEPENDENT_AMBULATORY_CARE_PROVIDER_SITE_OTHER): Payer: Medicare Other | Admitting: Internal Medicine

## 2013-12-03 ENCOUNTER — Encounter: Payer: Self-pay | Admitting: Internal Medicine

## 2013-12-03 VITALS — BP 130/76 | HR 68 | Temp 97.9°F | Ht 70.0 in | Wt 182.0 lb

## 2013-12-03 DIAGNOSIS — E119 Type 2 diabetes mellitus without complications: Secondary | ICD-10-CM

## 2013-12-03 DIAGNOSIS — F0280 Dementia in other diseases classified elsewhere without behavioral disturbance: Secondary | ICD-10-CM

## 2013-12-03 DIAGNOSIS — E785 Hyperlipidemia, unspecified: Secondary | ICD-10-CM

## 2013-12-03 DIAGNOSIS — G309 Alzheimer's disease, unspecified: Secondary | ICD-10-CM

## 2013-12-03 NOTE — Progress Notes (Signed)
Subjective:    Patient ID: Bryan Ramos, male    DOB: Dec 03, 1935, 78 y.o.   MRN: [id number]  HPI Medicare wellness  CBG's  Has increased insulin and has been increased sweets Seeing Dr Chalmers Cater for endocrine Memory loss discussions of reeses cups and M and M's Medications are being monitored   Review of Systems  Constitutional: Positive for fatigue. Negative for fever.  HENT: Negative for congestion, hearing loss and postnasal drip.   Eyes: Negative for discharge, redness and visual disturbance.  Respiratory: Negative for cough, shortness of breath and wheezing.   Cardiovascular: Negative for leg swelling.  Gastrointestinal: Negative for abdominal pain, constipation and abdominal distention.  Genitourinary: Positive for urgency. Negative for frequency.  Musculoskeletal: Negative for arthralgias, joint swelling and neck pain.  Skin: Negative for color change and rash.  Neurological: Positive for tremors. Negative for weakness and light-headedness.  Hematological: Negative for adenopathy.  Psychiatric/Behavioral: Negative for behavioral problems.   Past Medical History  Diagnosis Date  . Renal cell carcinoma   . Hyperlipidemia   . HSV infection   . Alzheimer's dementia     dr Erling Cruz  . Diabetes mellitus without complication     seeing [facility]  . Memory change     History   Social History  . Marital Status: Single    Spouse Name: Bryan Ramos    Number of Children: 0  . Years of Education: college   Occupational History  .      Retired   Social History Main Topics  . Smoking status: Never Smoker   . Smokeless tobacco: Never Used  . Alcohol Use: No  . Drug Use: No  . Sexual Activity: Not on file   Other Topics Concern  . Not on file   Social History Narrative   Patient lives at home with his wife Bryan Ramos). Patient is retired. College education. BA.   Right handed.   Caffeine- one cup daily.    Past Surgical History  Procedure Laterality Date  .  Removal of kidney    . Nephrectomy      Dr. Mar Daring  . Tonsillectomy      Family History  Problem Relation Age of Onset  . Stroke Father   . Alzheimer's disease    . Colon cancer Neg Hx   . Dementia Paternal Grandfather     Allergies  Allergen Reactions  . Ciprofloxacin     Nausea     Current Outpatient Prescriptions on File Prior to Visit  Medication Sig Dispense Refill  . aspirin 81 MG tablet Take 81 mg by mouth daily.      . insulin lispro protamine-lispro (HUMALOG 75/25 MIX) (75-25) 100 UNIT/ML SUSP injection Inject into the skin.      Marland Kitchen NAMENDA 10 MG tablet TAKE 1 TABLET TWICE A DAY  180 tablet  1  . Omega-3 Fatty Acids (OMEGA 3 PO) Take 1,200 mg by mouth daily.      Marland Kitchen omeprazole (PRILOSEC) 20 MG capsule TAKE 1 CAPSULE DAILY  90 capsule  1  . ondansetron (ZOFRAN) 4 MG tablet Take 1 tablet (4 mg total) by mouth every 8 (eight) hours as needed for nausea.  20 tablet  0  . ONE TOUCH ULTRA TEST test strip USE AS DIRECTED  100 each  3  . sildenafil (VIAGRA) 25 MG tablet Take 25 mg by mouth daily as needed for erectile dysfunction.      . sitaGLIPtin (JANUVIA) 100 MG tablet Take 100 mg by  mouth daily.      . Sulfacetamide-Sulfur-Sunscreen (ROSAC) 10-5 % CREA Apply 1 application topically 2 (two) times daily.  45 g  3  . valACYclovir (VALTREX) 500 MG tablet Take 1 tablet (500 mg total) by mouth daily.  90 tablet  3  . VYTORIN 10-40 MG per tablet TAKE 1 TABLET AT BEDTIME  100 tablet  3   No current facility-administered medications on file prior to visit.    BP 130/76  Pulse 68  Temp(Src) 97.9 F (36.6 C) (Oral)  Ht 5\' 10"  (1.778 m)  Wt 182 lb (82.555 kg)  BMI 26.11 kg/m2  SpO2 98%       Objective:   Physical Exam  Constitutional: He appears well-developed and well-nourished.  HENT:  Head: Normocephalic and atraumatic.  Eyes: Conjunctivae are normal. Pupils are equal, round, and reactive to light.  Neck: Normal range of motion. Neck supple.  Cardiovascular:  Normal rate and regular rhythm.   Murmur heard. Pulmonary/Chest: Effort normal and breath sounds normal.  Abdominal: Soft. Bowel sounds are normal.          Assessment & Plan:  Diet suggestions Veggies snaks and DM HTN Memory loss  Renal insufficiency and DM HTN stable  good lipids Stable screening labs

## 2013-12-03 NOTE — Patient Instructions (Signed)
Dark chocolate covered almonds Sugar free cholate covered nuts of any kind sugar free chocolate pudding and cool whip

## 2013-12-03 NOTE — Progress Notes (Signed)
Pre visit review using our clinic review tool, if applicable. No additional management support is needed unless otherwise documented below in the visit note. 

## 2013-12-08 ENCOUNTER — Other Ambulatory Visit: Payer: Self-pay | Admitting: Neurology

## 2013-12-08 ENCOUNTER — Telehealth: Payer: Self-pay | Admitting: Neurology

## 2013-12-08 MED ORDER — MEMANTINE HCL ER 28 MG PO CP24: 28.0000 mg | ORAL_CAPSULE | Freq: Every day | ORAL | Status: DC

## 2013-12-08 NOTE — Telephone Encounter (Signed)
Returned call. Will hold Aricept for now. Will transition from [place] IR twice a day to Namenda XR 28mg  once daily.

## 2013-12-08 NOTE — Telephone Encounter (Signed)
Called pt and spoke with pt's wife Margaretha Sheffield and she stated that the pt is having nausea taking Namenda and Aricept. Wife stated that the pt is taking Namenda in the morning and in the evening and taking Aricept at lunch. Wife stated that she can be reached at [phone]. Please advise

## 2013-12-08 NOTE — Telephone Encounter (Signed)
Patient's spouse called and stated the combination of NAMENDA 10 MG tablet and Aricept 5 mg, makes patient very nauseated.  Please call and advise.

## 2013-12-08 NOTE — Telephone Encounter (Signed)
Patient's wife calling back to speak with Tye Maryland again, please return call and advise.

## 2013-12-09 ENCOUNTER — Ambulatory Visit: Payer: Medicare Other | Admitting: Oncology

## 2013-12-14 ENCOUNTER — Ambulatory Visit (HOSPITAL_BASED_OUTPATIENT_CLINIC_OR_DEPARTMENT_OTHER): Payer: Medicare Other | Admitting: Oncology

## 2013-12-14 ENCOUNTER — Telehealth: Payer: Self-pay | Admitting: Oncology

## 2013-12-14 ENCOUNTER — Encounter: Payer: Self-pay | Admitting: Oncology

## 2013-12-14 VITALS — BP 137/69 | HR 63 | Temp 98.0°F | Resp 18 | Ht 70.0 in | Wt 181.3 lb

## 2013-12-14 DIAGNOSIS — R599 Enlarged lymph nodes, unspecified: Secondary | ICD-10-CM

## 2013-12-14 DIAGNOSIS — C649 Malignant neoplasm of unspecified kidney, except renal pelvis: Secondary | ICD-10-CM

## 2013-12-14 NOTE — Progress Notes (Signed)
Hematology and Oncology Follow Up Visit  Bryan Ramos [id number] 10-07-1935 78 y.o. 12/14/2013 9:07 AM   Principle Diagnosis: 78 year old gentleman with kidney cancer diagnosed in 2011. He presented with a 5 cm left kidney tumor and now he has possibly stage IV disease with small retroperitoneal lymphadenopathy.   Prior Therapy: He is status post left nephrectomy with the pathology revealed a renal cell carcinoma, clear cell type with Fuhrman grade 3/4 and the pathological staging was T1b Nx.    Current therapy: Observation and surveillance.  Interim History:  78 year old gentleman presents today for a followup visit. He denies Bryan Ramos saw back in consultation in November of 2014 with history of kidney cancer. He is status post nephrectomy in 2011 but on MRI in October of 2014 showed possible periaortic lymphadenopathy measuring 2.2 cm. This currently on observation and surveillance given the bulk of disease and his comorbid conditions. Since his last visit, he has been doing very well. He does not report any headaches blurred vision or double vision. Did not report any chest pain shortness of breath or difficulty breathing. Is not reporting any abdominal pain or change in his bowel habits. Does not report any constipation or diarrhea. Does not report any frequency urgency or hesitancy. Does not report any muscle or bone pain. Does not report any rashes or lesions. He has not reported any hospitalization or illnesses. Does not report any change in his performance status or quality of life. The rest of his review of system is unremarkable.  Medications: I have reviewed the patient's current medications.  Current Outpatient Prescriptions  Medication Sig Dispense Refill  . memantine (NAMENDA) 10 MG tablet Take 10 mg by mouth 2 (two) times daily.      Marland Kitchen aspirin 81 MG tablet Take 81 mg by mouth daily.      . insulin lispro protamine-lispro (HUMALOG 75/25 MIX) (75-25) 100 UNIT/ML SUSP injection Inject  into the skin.      . Memantine HCl ER 28 MG CP24 Take 28 mg by mouth daily.  90 capsule  6  . Omega-3 Fatty Acids (OMEGA 3 PO) Take 1,200 mg by mouth daily.      Marland Kitchen omeprazole (PRILOSEC) 20 MG capsule TAKE 1 CAPSULE DAILY  90 capsule  1  . ondansetron (ZOFRAN) 4 MG tablet Take 1 tablet (4 mg total) by mouth every 8 (eight) hours as needed for nausea.  20 tablet  0  . ONE TOUCH ULTRA TEST test strip USE AS DIRECTED  100 each  3  . sildenafil (VIAGRA) 25 MG tablet Take 25 mg by mouth daily as needed for erectile dysfunction.      . sitaGLIPtin (JANUVIA) 100 MG tablet Take 100 mg by mouth daily.      . Sulfacetamide-Sulfur-Sunscreen (ROSAC) 10-5 % CREA Apply 1 application topically 2 (two) times daily.  45 g  3  . valACYclovir (VALTREX) 500 MG tablet Take 1 tablet (500 mg total) by mouth daily.  90 tablet  3  . VYTORIN 10-40 MG per tablet TAKE 1 TABLET AT BEDTIME  100 tablet  3   No current facility-administered medications for this visit.     Allergies:  Allergies  Allergen Reactions  . Ciprofloxacin     Nausea     Past Medical History, Surgical history, Social history, and Family History were reviewed and updated.   Physical Exam: Blood pressure 137/69, pulse 63, temperature 98 F (36.7 C), temperature source Oral, resp. rate 18, height 5\' 10"  (1.778 m), weight  181 lb 4.8 oz (82.237 kg). ECOG: 1 General appearance: alert Head: Normocephalic, without obvious abnormality Neck: no adenopathy Lymph nodes: Cervical, supraclavicular, and axillary nodes normal. Heart:regular rate and rhythm, S1, S2 normal, no murmur, click, rub or gallop Lung:chest clear, no wheezing, rales, normal symmetric air entry Abdomin: soft, non-tender, without masses or organomegaly EXT:no erythema, induration, or nodules   Lab Results: Lab Results  Component Value Date   WBC 5.7 12/02/2013   HGB 14.1 12/02/2013   HCT 42.1 12/02/2013   MCV 96.1 12/02/2013   PLT 204.0 12/02/2013     Chemistry       Component Value Date/Time   NA 139 12/02/2013 1006   NA 142 06/11/2013 1045   K 4.5 12/02/2013 1006   K 4.9 06/11/2013 1045   CL 107 12/02/2013 1006   CO2 26 12/02/2013 1006   CO2 27 06/11/2013 1045   BUN 19 12/02/2013 1006   BUN 22.1 06/11/2013 1045   CREATININE 1.4 12/02/2013 1006   CREATININE 1.6* 06/11/2013 1045      Component Value Date/Time   CALCIUM 9.2 12/02/2013 1006   CALCIUM 9.8 06/11/2013 1045   ALKPHOS 90 12/02/2013 1006   ALKPHOS 104 06/11/2013 1045   AST 25 12/02/2013 1006   AST 27 06/11/2013 1045   ALT 19 12/02/2013 1006   ALT 22 06/11/2013 1045   BILITOT 1.7* 12/02/2013 1006   BILITOT 1.80* 06/11/2013 1045        Impression and Plan:  78 year old gentleman with history of kidney cancer and likely relapse disease. His initial diagnosis was in 2011 where he had a 5 cm left kidney tumor. He is status post left nephrectomy with the pathology revealed a renal cell carcinoma, clear cell type with Fuhrman grade 3/4 and the pathological staging was T1b Nx. He has been on active surveillance and an MRI in October of 2014 showed slightly enlarged periaortic lymphadenopathy. The plan is to continue active surveillance and repeat imaging studies including a CT scan chest abdomen and pelvis to rule out metastatic disease. If he continues to have very low bulk disease at not dramatically changed we'll continue with observation. If his disease change rapidly, we will biopsy those lesions and Institute treatments accordingly. This plan was discussed in detail with the patient and his wife and is agreeable at the time being. We will set up a CT scan in the near future.   [name], MD 6/2/20159:07 AM

## 2013-12-14 NOTE — Telephone Encounter (Signed)
gva dnpritned appt sched and avs for opt for Sept...gv pt barium

## 2013-12-31 ENCOUNTER — Encounter (HOSPITAL_COMMUNITY): Payer: Self-pay

## 2013-12-31 ENCOUNTER — Ambulatory Visit (HOSPITAL_COMMUNITY)
Admission: RE | Admit: 2013-12-31 | Discharge: 2013-12-31 | Disposition: A | Discharge status: Home / Self Care | Payer: Medicare Other | Source: Ambulatory Visit | Attending: Oncology | Admitting: Oncology

## 2013-12-31 ENCOUNTER — Other Ambulatory Visit: Payer: Self-pay | Admitting: Oncology

## 2013-12-31 DIAGNOSIS — Z905 Acquired absence of kidney: Secondary | ICD-10-CM | POA: Insufficient documentation

## 2013-12-31 DIAGNOSIS — I700 Atherosclerosis of aorta: Secondary | ICD-10-CM | POA: Insufficient documentation

## 2013-12-31 DIAGNOSIS — Z85528 Personal history of other malignant neoplasm of kidney: Secondary | ICD-10-CM | POA: Insufficient documentation

## 2013-12-31 DIAGNOSIS — I2510 Atherosclerotic heart disease of native coronary artery without angina pectoris: Secondary | ICD-10-CM | POA: Insufficient documentation

## 2013-12-31 DIAGNOSIS — C649 Malignant neoplasm of unspecified kidney, except renal pelvis: Secondary | ICD-10-CM

## 2013-12-31 DIAGNOSIS — R599 Enlarged lymph nodes, unspecified: Secondary | ICD-10-CM | POA: Insufficient documentation

## 2013-12-31 DIAGNOSIS — N281 Cyst of kidney, acquired: Secondary | ICD-10-CM | POA: Insufficient documentation

## 2013-12-31 DIAGNOSIS — K8020 Calculus of gallbladder without cholecystitis without obstruction: Secondary | ICD-10-CM | POA: Insufficient documentation

## 2013-12-31 DIAGNOSIS — N400 Enlarged prostate without lower urinary tract symptoms: Secondary | ICD-10-CM | POA: Insufficient documentation

## 2013-12-31 MED ORDER — IOHEXOL 300 MG/ML  SOLN
100.0000 mL | Freq: Once | INTRAMUSCULAR | Status: AC | PRN
  Administered 2013-12-31: 100 mL via INTRAVENOUS

## 2014-01-04 ENCOUNTER — Ambulatory Visit (HOSPITAL_COMMUNITY): Payer: Medicare Other

## 2014-02-05 ENCOUNTER — Telehealth: Payer: Self-pay | Admitting: Oncology

## 2014-02-05 NOTE — Telephone Encounter (Signed)
DUE TO FS PAL MOVED 9/1 APPT TO 9/9. S/W WIFE SHE IS AWARE.

## 2014-02-17 NOTE — Telephone Encounter (Signed)
Noted  

## 2014-02-20 ENCOUNTER — Other Ambulatory Visit: Payer: Self-pay | Admitting: Internal Medicine

## 2014-02-25 ENCOUNTER — Telehealth: Payer: Self-pay | Admitting: Internal Medicine

## 2014-02-25 NOTE — Telephone Encounter (Signed)
lmovm to call and est with [facility]

## 2014-03-07 NOTE — Telephone Encounter (Signed)
Appt scheduled

## 2014-03-09 ENCOUNTER — Encounter: Payer: Self-pay | Admitting: Oncology

## 2014-03-15 ENCOUNTER — Other Ambulatory Visit: Payer: Medicare Other

## 2014-03-15 ENCOUNTER — Ambulatory Visit: Payer: Medicare Other | Admitting: Oncology

## 2014-03-22 ENCOUNTER — Other Ambulatory Visit: Payer: Self-pay | Admitting: Oncology

## 2014-03-22 DIAGNOSIS — C649 Malignant neoplasm of unspecified kidney, except renal pelvis: Secondary | ICD-10-CM

## 2014-03-23 ENCOUNTER — Ambulatory Visit (HOSPITAL_BASED_OUTPATIENT_CLINIC_OR_DEPARTMENT_OTHER): Payer: Medicare Other | Admitting: Oncology

## 2014-03-23 ENCOUNTER — Telehealth: Payer: Self-pay | Admitting: Oncology

## 2014-03-23 ENCOUNTER — Other Ambulatory Visit (HOSPITAL_BASED_OUTPATIENT_CLINIC_OR_DEPARTMENT_OTHER): Payer: Medicare Other

## 2014-03-23 VITALS — BP 151/86 | HR 63 | Temp 98.3°F | Resp 18 | Ht 70.0 in | Wt 182.8 lb

## 2014-03-23 DIAGNOSIS — C649 Malignant neoplasm of unspecified kidney, except renal pelvis: Secondary | ICD-10-CM

## 2014-03-23 LAB — CBC WITH DIFFERENTIAL/PLATELET
BASO%: 1.2 % (ref 0.0–2.0)
BASOS ABS: 0.1 10*3/uL (ref 0.0–0.1)
EOS%: 2.8 % (ref 0.0–7.0)
Eosinophils Absolute: 0.2 10*3/uL (ref 0.0–0.5)
HEMATOCRIT: 41.8 % (ref 38.4–49.9)
HEMOGLOBIN: 13.7 g/dL (ref 13.0–17.1)
LYMPH%: 18.4 % (ref 14.0–49.0)
MCH: 31.8 pg (ref 27.2–33.4)
MCHC: 32.8 g/dL (ref 32.0–36.0)
MCV: 96.8 fL (ref 79.3–98.0)
MONO#: 0.4 10*3/uL (ref 0.1–0.9)
MONO%: 7.5 % (ref 0.0–14.0)
NEUT#: 4.1 10*3/uL (ref 1.5–6.5)
NEUT%: 70.1 % (ref 39.0–75.0)
Platelets: 206 10*3/uL (ref 140–400)
RBC: 4.32 10*6/uL (ref 4.20–5.82)
RDW: 13.4 % (ref 11.0–14.6)
WBC: 5.9 10*3/uL (ref 4.0–10.3)
lymph#: 1.1 10*3/uL (ref 0.9–3.3)

## 2014-03-23 LAB — COMPREHENSIVE METABOLIC PANEL (CC13)
ALT: 21 U/L (ref 0–55)
ANION GAP: 8 meq/L (ref 3–11)
AST: 26 U/L (ref 5–34)
Albumin: 3.5 g/dL (ref 3.5–5.0)
Alkaline Phosphatase: 95 U/L (ref 40–150)
BILIRUBIN TOTAL: 1.79 mg/dL — AB (ref 0.20–1.20)
BUN: 20.6 mg/dL (ref 7.0–26.0)
CALCIUM: 9 mg/dL (ref 8.4–10.4)
CHLORIDE: 107 meq/L (ref 98–109)
CO2: 24 meq/L (ref 22–29)
CREATININE: 1.7 mg/dL — AB (ref 0.7–1.3)
Glucose: 272 mg/dl — ABNORMAL HIGH (ref 70–140)
Potassium: 4.6 mEq/L (ref 3.5–5.1)
Sodium: 139 mEq/L (ref 136–145)
Total Protein: 7 g/dL (ref 6.4–8.3)

## 2014-03-23 NOTE — Progress Notes (Signed)
Hematology and Oncology Follow Up Visit  Bryan Ramos [id number] 11/25/1935 78 y.o. 03/23/2014 1:38 PM   Principle Diagnosis: 78 year old gentleman with kidney cancer diagnosed in 2011. He presented with a 5 cm left kidney tumor and now he has possibly stage IV disease with small retroperitoneal lymphadenopathy.   Prior Therapy: He is status post left nephrectomy with the pathology revealed a renal cell carcinoma, clear cell type with Fuhrman grade 3/4 and the pathological staging was T1b Nx.    Current therapy: Observation and surveillance.  Interim History: Bryan Ramos presents today for a followup visit with his wife.  Since his last visit, he has been doing very well. He has not reported any complaints to suggest cancer relapse. He does not report any headaches blurred vision or double vision. Did not report any chest pain shortness of breath or difficulty breathing. Is not reporting any abdominal pain or change in his bowel habits. Does not report any constipation or diarrhea. Does not report any frequency urgency or hesitancy. Does not report any muscle or bone pain. Does not report any rashes or lesions. He has not reported any hospitalization or illnesses. Does not report any change in his performance status or quality of life. The rest of his review of system is unremarkable.  Medications: I have reviewed the patient's current medications.  Current Outpatient Prescriptions  Medication Sig Dispense Refill  . aspirin 81 MG tablet Take 81 mg by mouth daily.      . insulin lispro protamine-lispro (HUMALOG 75/25 MIX) (75-25) 100 UNIT/ML SUSP injection Inject into the skin.      . Memantine HCl ER 28 MG CP24 Take 28 mg by mouth daily.  90 capsule  6  . Omega-3 Fatty Acids (OMEGA 3 PO) Take 1,200 mg by mouth daily.      Marland Kitchen omeprazole (PRILOSEC) 20 MG capsule TAKE 1 CAPSULE DAILY  90 capsule  2  . ondansetron (ZOFRAN) 4 MG tablet Take 1 tablet (4 mg total) by mouth every 8 (eight) hours as  needed for nausea.  20 tablet  0  . ONE TOUCH ULTRA TEST test strip USE AS DIRECTED  100 each  3  . sildenafil (VIAGRA) 25 MG tablet Take 25 mg by mouth daily as needed for erectile dysfunction.      . sitaGLIPtin (JANUVIA) 100 MG tablet Take 100 mg by mouth daily.      . Sulfacetamide-Sulfur-Sunscreen (ROSAC) 10-5 % CREA Apply 1 application topically 2 (two) times daily.  45 g  3  . valACYclovir (VALTREX) 500 MG tablet Take 1 tablet (500 mg total) by mouth daily.  90 tablet  3  . VYTORIN 10-40 MG per tablet TAKE 1 TABLET AT BEDTIME  100 tablet  3   No current facility-administered medications for this visit.     Allergies:  Allergies  Allergen Reactions  . Ciprofloxacin     Nausea     Past Medical History, Surgical history, Social history, and Family History were reviewed and updated.   Physical Exam: Blood pressure 151/86, pulse 63, temperature 98.3 F (36.8 C), temperature source Oral, resp. rate 18, height 5\' 10"  (1.778 m), weight 182 lb 12.8 oz (82.918 kg). ECOG: 1 General appearance: alert Head: Normocephalic, without obvious abnormality Neck: no adenopathy Lymph nodes: Cervical, supraclavicular, and axillary nodes normal. Heart:regular rate and rhythm, S1, S2 normal, no murmur, click, rub or gallop Lung:chest clear, no wheezing, rales, normal symmetric air entry Abdomin: soft, non-tender, without masses or organomegaly EXT:no erythema, induration,  or nodules   Lab Results: Lab Results  Component Value Date   WBC 5.9 03/23/2014   HGB 13.7 03/23/2014   HCT 41.8 03/23/2014   MCV 96.8 03/23/2014   PLT 206 03/23/2014     Chemistry      Component Value Date/Time   NA 139 12/02/2013 1006   NA 142 06/11/2013 1045   K 4.5 12/02/2013 1006   K 4.9 06/11/2013 1045   CL 107 12/02/2013 1006   CO2 26 12/02/2013 1006   CO2 27 06/11/2013 1045   BUN 19 12/02/2013 1006   BUN 22.1 06/11/2013 1045   CREATININE 1.4 12/02/2013 1006   CREATININE 1.6* 06/11/2013 1045      Component Value  Date/Time   CALCIUM 9.2 12/02/2013 1006   CALCIUM 9.8 06/11/2013 1045   ALKPHOS 90 12/02/2013 1006   ALKPHOS 104 06/11/2013 1045   AST 25 12/02/2013 1006   AST 27 06/11/2013 1045   ALT 19 12/02/2013 1006   ALT 22 06/11/2013 1045   BILITOT 1.7* 12/02/2013 1006   BILITOT 1.80* 06/11/2013 1045     EXAM:  CT CHEST WITH CONTRAST  CT ABDOMEN AND PELVIS WITH AND WITHOUT CONTRAST  TECHNIQUE:  Multidetector CT imaging of the chest was performed during  intravenous contrast administration. Multidetector CT imaging of the  abdomen and pelvis was performed following the standard protocol  before and during bolus administration of intravenous contrast.  CONTRAST: 118mL OMNIPAQUE IOHEXOL 300 MG/ML SOLN  COMPARISON: MRI from 05/06/2013. CT scan from 04/13/2013.  FINDINGS:  CT CHEST FINDINGS  Soft tissue / Mediastinum: There is no axillary lymphadenopathy. No  mediastinal or hilar lymphadenopathy. Heart size is normal. Coronary  artery calcification is noted. No pericardial effusion.  Lungs / Pleura: Lungs are clear without pulmonary parenchymal nodule  or mass. No focal airspace consolidation or pulmonary edema. No  pleural effusion.  Bones: Bone windows reveal no worrisome lytic or sclerotic osseous  lesions.  CT ABDOMEN AND PELVIS FINDINGS  Liver: Normal.  Spleen: Normal.  Stomach: Nondistended.  Pancreas: Normal without focal mass lesion or main duct dilatation.  Gallbladder/Biliary Tree: Small calcified gallstones again noted. No  intra or extrahepatic biliary duct dilatation.  Kidneys/Adrenals: No adrenal nodule or mass. 16 mm posterior right  renal lesion characterized as a hemorrhagic cyst on the recent MRI  is unchanged. Left kidney is surgically absent.  Bowel Loops: Duodenum is unremarkable. No evidence for small bowel  obstruction or small bowel wall thickening. The terminal ileum is  normal. The appendix is not visualized, but there is no edema or  inflammation in the region of  the cecum. Diverticular changes seen  in the sigmoid colon without diverticulitis.  Nodes: 2.3 cm heterogeneously enhancing soft tissue nodule in the  left para-aortic space is unchanged since the MRI and also when  comparing back to the CT scan of 04/13/2013. This remains very  suspicious for metastatic disease. No new retroperitoneal or  intraperitoneal lymphadenopathy in the abdomen.  Vasculature: Atherosclerotic calcification is noted in the wall of  the abdominal aorta without aneurysm.  Pelvic Genitourinary: Bladder is unremarkable. Prostate gland  appears mildly enlarged.  Bones/Musculoskeletal: Bone windows reveal no worrisome lytic or  sclerotic osseous lesions.  Body Wall: No evidence for abdominal wall hernia.  Other: No intraperitoneal free fluid.  IMPRESSION:  1. No evidence for metastatic disease in the chest.  2. No change in the 2.3 cm heterogeneously enhancing left  para-aortic lymph node suspicious for metastatic involvement. This  has been stable since 04/13/2013, but does appear progressed since  01/28/2012 when it 1.4 cm. No new abdominal lymphadenopathy.  3. 16 mm hemorrhagic cyst in the posterior right kidney.    Impression and Plan:  78 year old gentleman with: 1. History of kidney cancer and likely relapse disease. His initial diagnosis was in 2011 where he had a 5 cm left kidney tumor. He is status post left nephrectomy with the pathology revealed a renal cell carcinoma, clear cell type with Fuhrman grade 3/4 and the pathological staging was T1b Nx. He has been on active surveillance and an MRI in October of 2014 showed slightly enlarged periaortic lymphadenopathy. Followup CT scan in June of 2015 showed a 2.3 cm periaortic lymph node that had been relatively stable from previous examination. Given his age, comorbidities and very low volume disease I see no reason or indication for systemic disease. I plan to continue active surveillance and repeat imaging  studies in December of 2015.  2. Followup: Will be in 3 months after a repeat CT scan.   Zola Button, MD 9/9/20151:38 PM

## 2014-03-23 NOTE — Telephone Encounter (Signed)
gv adn printed appt sched and avs for pt for Dec....gv pt barium °

## 2014-04-21 ENCOUNTER — Encounter: Payer: Self-pay | Admitting: Adult Health

## 2014-04-21 ENCOUNTER — Ambulatory Visit (INDEPENDENT_AMBULATORY_CARE_PROVIDER_SITE_OTHER): Payer: Medicare Other | Admitting: Adult Health

## 2014-04-21 VITALS — BP 140/83 | HR 64 | Ht 70.0 in | Wt 183.2 lb

## 2014-04-21 DIAGNOSIS — R413 Other amnesia: Secondary | ICD-10-CM

## 2014-04-21 NOTE — Progress Notes (Signed)
I have read the note, and I agree with the clinical assessment and plan.  WILLIS,CHARLES KEITH   

## 2014-04-21 NOTE — Patient Instructions (Signed)

## 2014-04-21 NOTE — Progress Notes (Signed)
PATIENT: Bryan Ramos DOB: 1935/10/02  REASON FOR VISIT: follow up HISTORY FROM: patient  HISTORY OF PRESENT ILLNESS: Mr. Bryan Ramos is a 78 year old male with a history of memory loss. He returns today for follow-up. he is currently taking Namenda. He was unable to tolerate the Aricept and Namenda together. He reports that his memory has remained the same but his wife feels that it has gotten worse. Wife states that the most difficult thing is he is having word finding issues.  He is able to complete all ADLs without assistance. Denies having to give up anything due to his memory. He continues to play basketball at the [facility] [employer]. He continues to operates a Teacher, music. Wife states that he does well but he did get lost when he was coming home from basketball. Wife has taken over the finances. He continues to fix his own breakfast but otherwise his wife does all the cooking. He does have diabetes and tends to snack a lot because he does not remember eating.   HISTORY 10/19/13 (PS): Bryan Ramos is a 78 y.o. male here as a referral from Dr. Arnoldo Morale for cognitive decline. Last visit was 06/2013 at which time he was started on Aricept 5mg  in addition to [place]. States he has had nausea every time he starts taking Aricept. They feel that the memory is about the same, wife feels slightly worse, slightly worse short term memory. He continues to drive, no difficulties, wife feels he is fine driving. He had one episode of getting lost but otherwise she feels he is doing well. No gait instability, no trips or falls. Prior visit with Dr Erling Cruz [id number]: 78 year old right-handed white married male with a 5 yr history of progressive memory loss and family history of memory loss evaluated with an MRI study of the brain, TSH, B12,and RPR. I thought there was hippocampal atrophy on the MRI of the brain. He is on Aricept 10 mg daily. He was first seen 03/27/09 with MMSE 23/30, clock drawing task 4/4, 16 four legged animals in one  minute. Repeat evaluation 05/29/09 with MMSE 26/30, clock drawing test 3/4, and 12 four legged animals in one minute. Evaluation 04/03/11 with MMSE 24/30, clock drawing task 4/4, and animal fluency 11. Evaluation 09/17/2011 MMSE 26/30. CDT 4/4. AFT 16. He is independent with activities of daily living. He lives with his wife, married for 1-1/2 years. He exercises on a regular basis. He uses a brace on his left leg and has restarted playing basketball for one and a half hours at a time, Mondays, Wednesdays, and Fridays.. He has left lateral knee numbness without significant pain.There is no history of drug or alcohol use. He denies depression, falls, hallucinations or bowel or bladder incontinence. He is sleeping well.He has no falls. He denies depression. He denies problems driving. is not playing card games use a computeror perform memory games. His socialization is traveling to [facility] to see his wife. She states he doesn't talk much improved.He checks his capillary blood glucoses twice per day. 05/21/2012= MMSE 22/30. Clock drawing task 4/4. Animal fluency test 13. [name] index of independence in activities of daily living 6. Lawton-Brody instrumental activities of daily living scale 6. Neuropsychiatric inventory= irritability 4. Geriatric depression scale 0/15. Falls assessment tool score 2.   REVIEW OF SYSTEMS: Full 14 system review of systems performed and notable only for:  Constitutional: N/A  Eyes: eye discharge Ear/Nose/Throat: N/A  Skin: N/A  Cardiovascular: N/A  Respiratory: N/A  Gastrointestinal: N/A  Genitourinary: N/A Hematology/Lymphatic: N/A  Endocrine: N/A Musculoskeletal:N/A  Allergy/Immunology: N/A  Neurological: N/A Psychiatric: agitation, confusion, decreased concentration Sleep: N/A   ALLERGIES: Allergies  Allergen Reactions  . Ciprofloxacin     Nausea     HOME MEDICATIONS: Outpatient Prescriptions Prior to Visit  Medication Sig Dispense Refill  .  aspirin 81 MG tablet Take 81 mg by mouth daily.      . insulin lispro protamine-lispro (HUMALOG 75/25 MIX) (75-25) 100 UNIT/ML SUSP injection Inject into the skin.      . Memantine HCl ER 28 MG CP24 Take 28 mg by mouth daily.  90 capsule  6  . Omega-3 Fatty Acids (OMEGA 3 PO) Take 1,200 mg by mouth daily.      Marland Kitchen omeprazole (PRILOSEC) 20 MG capsule TAKE 1 CAPSULE DAILY  90 capsule  2  . ondansetron (ZOFRAN) 4 MG tablet Take 1 tablet (4 mg total) by mouth every 8 (eight) hours as needed for nausea.  20 tablet  0  . ONE TOUCH ULTRA TEST test strip USE AS DIRECTED  100 each  3  . sildenafil (VIAGRA) 25 MG tablet Take 25 mg by mouth daily as needed for erectile dysfunction.      . sitaGLIPtin (JANUVIA) 100 MG tablet Take 100 mg by mouth daily.      . Sulfacetamide-Sulfur-Sunscreen (ROSAC) 10-5 % CREA Apply 1 application topically 2 (two) times daily.  45 g  3  . valACYclovir (VALTREX) 500 MG tablet Take 1 tablet (500 mg total) by mouth daily.  90 tablet  3  . VYTORIN 10-40 MG per tablet TAKE 1 TABLET AT BEDTIME  100 tablet  3   No facility-administered medications prior to visit.    PAST MEDICAL HISTORY: Past Medical History  Diagnosis Date  . Hyperlipidemia   . HSV infection   . Alzheimer's dementia     dr Erling Cruz  . Diabetes mellitus without complication     seeing [facility]  . Memory change   . Renal cell carcinoma     PAST SURGICAL HISTORY: Past Surgical History  Procedure Laterality Date  . Removal of kidney    . Nephrectomy      Dr. Mar Daring  . Tonsillectomy      FAMILY HISTORY: Family History  Problem Relation Age of Onset  . Stroke Father   . Alzheimer's disease    . Colon cancer Neg Hx   . Dementia Paternal Grandfather     SOCIAL HISTORY: History   Social History  . Marital Status: Married    Spouse Name: Margaretha Sheffield    Number of Children: 0  . Years of Education: college   Occupational History  .      Retired   Social History Main Topics  . Smoking  status: Never Smoker   . Smokeless tobacco: Never Used  . Alcohol Use: No  . Drug Use: No  . Sexual Activity: Not on file   Other Topics Concern  . Not on file   Social History Narrative   Patient lives at home with his wife Margaretha Sheffield). Patient is retired. College education. BA.   Right handed.   Caffeine- one cup daily.      PHYSICAL EXAM  Filed Vitals:   04/21/14 1440  BP: 140/83  Pulse: 64  Height: 5\' 10"  (1.778 m)  Weight: 183 lb 3.2 oz (83.099 kg)   Body mass index is 26.29 kg/(m^2).  Generalized: Well developed, in no acute distress   Neurological examination  Mentation: Alert oriented to time, place, history taking. Follows all commands speech and language fluent. MMSE 12/30.  Cranial nerve II-XII: Pupils were equal round reactive to light. Extraocular movements were full, visual field were full on confrontational test. Facial sensation and strength were normal.Uvula tongue midline. Head turning and shoulder shrug  were normal and symmetric. Motor: The motor testing reveals 5 over 5 strength of all 4 extremities. Good symmetric motor tone is noted throughout.  Sensory: Sensory testing is intact to soft touch on all 4 extremities. No evidence of extinction is noted.  Coordination: Cerebellar testing reveals good finger-nose-finger and heel-to-shin bilaterally.  Gait and station: Gait is normal. Tandem gait is normal. Romberg is negative. No drift is seen.  Reflexes: Deep tendon reflexes are symmetric and normal bilaterally.    DIAGNOSTIC DATA (LABS, IMAGING, TESTING) - I reviewed patient records, labs, notes, testing and imaging myself where available.  Lab Results  Component Value Date   WBC 5.9 03/23/2014   HGB 13.7 03/23/2014   HCT 41.8 03/23/2014   MCV 96.8 03/23/2014   PLT 206 03/23/2014      Component Value Date/Time   NA 139 03/23/2014 1312   NA 139 12/02/2013 1006   K 4.6 03/23/2014 1312   K 4.5 12/02/2013 1006   CL 107 12/02/2013 1006   CO2 24 03/23/2014 1312    CO2 26 12/02/2013 1006   GLUCOSE 272* 03/23/2014 1312   GLUCOSE 210* 12/02/2013 1006   BUN 20.6 03/23/2014 1312   BUN 19 12/02/2013 1006   CREATININE 1.7* 03/23/2014 1312   CREATININE 1.4 12/02/2013 1006   CALCIUM 9.0 03/23/2014 1312   CALCIUM 9.2 12/02/2013 1006   PROT 7.0 03/23/2014 1312   PROT 6.7 12/02/2013 1006   ALBUMIN 3.5 03/23/2014 1312   ALBUMIN 3.7 12/02/2013 1006   AST 26 03/23/2014 1312   AST 25 12/02/2013 1006   ALT 21 03/23/2014 1312   ALT 19 12/02/2013 1006   ALKPHOS 95 03/23/2014 1312   ALKPHOS 90 12/02/2013 1006   BILITOT 1.79* 03/23/2014 1312   BILITOT 1.7* 12/02/2013 1006   GFRNONAA 49* 02/19/2013 1051   GFRAA 57* 02/19/2013 1051   Lab Results  Component Value Date   CHOL 149 12/02/2013   HDL 46.20 12/02/2013   LDLCALC 88 12/02/2013   LDLDIRECT 228.9 05/08/2012   TRIG 74.0 12/02/2013   CHOLHDL 3 12/02/2013   Lab Results  Component Value Date   HGBA1C 8.5* 12/02/2013   Lab Results  Component Value Date   VITAMINB12 585 06/06/2009   Lab Results  Component Value Date   TSH 2.14 12/02/2013      ASSESSMENT AND PLAN 78 y.o. year old male  has a past medical history of Hyperlipidemia; HSV infection; Alzheimer's dementia; Diabetes mellitus without complication; Memory change; and Renal cell carcinoma. here with:  1. Memory loss  Patient is currently taking Namenda. He could not tolerating the Aricept with the Namenda. His MMSE score has decreased, it is 12/30 whereas before it was 17/30. I advised the patient and his wife that his driving should be scrutinized due to his memory score. They verbalized understanding. Patient remains active and plays basketball at the [facility] weekly. He should continue Namenda. I will refill today. Patient will follow-up in 6 months or sooner if needed.    Ward Givens, MSN, NP-C 04/21/2014, 2:59 PM [facility] [address] [place], [place] [address] [phone]  Note: This document was prepared with digital dictation and  possible  smart Company secretary. Any transcriptional errors that result from this process are unintentional.

## 2014-05-12 ENCOUNTER — Telehealth: Payer: Self-pay | Admitting: Adult Health

## 2014-05-12 MED ORDER — DONEPEZIL HCL 5 MG PO TABS: 5.0000 mg | ORAL_TABLET | Freq: Every day | ORAL | Status: DC

## 2014-05-12 NOTE — Telephone Encounter (Signed)
Patient's spouse calling Megan, NP back regarding combining Aricept 23 mg and Namenda XR 28 mg.  Please call to discuss further and may leave detailed message on voicemail.

## 2014-05-12 NOTE — Telephone Encounter (Signed)
I spoke to the patient's spouse. She states that the patient was taking Aricept 23 mg daily. He could not tolerate the Aricept do to nausea. The spouse is wondering if we try it at a lower dose if he could  Tolerate it in conjunction with the Namenda. I will call in prescription for Aricept 5 mg at bedtime. If he experiences nausea with this medication then we will need to discontinue it.

## 2014-06-13 ENCOUNTER — Telehealth: Payer: Self-pay | Admitting: Adult Health

## 2014-06-13 MED ORDER — DONEPEZIL HCL 5 MG PO TABS: 5.0000 mg | ORAL_TABLET | Freq: Every day | ORAL | Status: DC

## 2014-06-13 NOTE — Telephone Encounter (Signed)
Rx has been sent.  I called back.  They are aware Rx has been sent and they wish to finish the meds he has before filling new Rx.

## 2014-06-13 NOTE — Telephone Encounter (Signed)
Pt is currently taking Aricept 5mg  and needs a new Rx sent to [facility] and he also has a 3 month supply of Namenda 10mg  left.  He wants to know if he could finish taking this or did he need to take the new Rx for Namenda?  Please call and advise.

## 2014-06-14 ENCOUNTER — Encounter: Payer: Self-pay | Admitting: Adult Health

## 2014-06-14 ENCOUNTER — Telehealth: Payer: Self-pay | Admitting: Adult Health

## 2014-06-14 NOTE — Telephone Encounter (Signed)
I have written a letter for jury duty. We will mail it to the patient or she can come pick it up. Please call the patient.

## 2014-06-14 NOTE — Telephone Encounter (Signed)
Patient spouse requesting a letter excusing patient from [employer] duty due to Alzheimer's.  Please call and advise.

## 2014-06-15 NOTE — Telephone Encounter (Signed)
Called patient, LM to call and advise method of delivery.

## 2014-06-15 NOTE — Telephone Encounter (Signed)
Spouse returned call and stated she would pick up form.

## 2014-06-15 NOTE — Telephone Encounter (Signed)
Put at the front for pickup

## 2014-06-28 ENCOUNTER — Ambulatory Visit (HOSPITAL_COMMUNITY): Payer: Medicare Other

## 2014-06-28 ENCOUNTER — Other Ambulatory Visit: Payer: Medicare Other

## 2014-06-30 ENCOUNTER — Ambulatory Visit: Payer: Medicare Other | Admitting: Oncology

## 2014-07-12 ENCOUNTER — Encounter (HOSPITAL_COMMUNITY): Payer: Self-pay

## 2014-07-12 ENCOUNTER — Ambulatory Visit (HOSPITAL_COMMUNITY)
Admission: RE | Admit: 2014-07-12 | Discharge: 2014-07-12 | Disposition: A | Discharge status: Home / Self Care | Payer: Medicare Other | Source: Ambulatory Visit | Attending: Oncology | Admitting: Oncology

## 2014-07-12 ENCOUNTER — Other Ambulatory Visit: Payer: Self-pay | Admitting: Oncology

## 2014-07-12 ENCOUNTER — Other Ambulatory Visit (HOSPITAL_BASED_OUTPATIENT_CLINIC_OR_DEPARTMENT_OTHER): Payer: Medicare Other

## 2014-07-12 DIAGNOSIS — C642 Malignant neoplasm of left kidney, except renal pelvis: Secondary | ICD-10-CM | POA: Insufficient documentation

## 2014-07-12 DIAGNOSIS — K8020 Calculus of gallbladder without cholecystitis without obstruction: Secondary | ICD-10-CM | POA: Insufficient documentation

## 2014-07-12 DIAGNOSIS — N281 Cyst of kidney, acquired: Secondary | ICD-10-CM | POA: Diagnosis not present

## 2014-07-12 DIAGNOSIS — I700 Atherosclerosis of aorta: Secondary | ICD-10-CM | POA: Insufficient documentation

## 2014-07-12 DIAGNOSIS — C649 Malignant neoplasm of unspecified kidney, except renal pelvis: Secondary | ICD-10-CM

## 2014-07-12 DIAGNOSIS — I2510 Atherosclerotic heart disease of native coronary artery without angina pectoris: Secondary | ICD-10-CM | POA: Insufficient documentation

## 2014-07-12 DIAGNOSIS — Z905 Acquired absence of kidney: Secondary | ICD-10-CM | POA: Diagnosis not present

## 2014-07-12 LAB — CBC WITH DIFFERENTIAL/PLATELET
BASO%: 1 % (ref 0.0–2.0)
Basophils Absolute: 0.1 10*3/uL (ref 0.0–0.1)
EOS%: 8.9 % — AB (ref 0.0–7.0)
Eosinophils Absolute: 0.5 10*3/uL (ref 0.0–0.5)
HCT: 42.7 % (ref 38.4–49.9)
HGB: 14.1 g/dL (ref 13.0–17.1)
LYMPH%: 25 % (ref 14.0–49.0)
MCH: 31.7 pg (ref 27.2–33.4)
MCHC: 33 g/dL (ref 32.0–36.0)
MCV: 96 fL (ref 79.3–98.0)
MONO#: 0.6 10*3/uL (ref 0.1–0.9)
MONO%: 10.2 % (ref 0.0–14.0)
NEUT#: 3.2 10*3/uL (ref 1.5–6.5)
NEUT%: 54.9 % (ref 39.0–75.0)
PLATELETS: 187 10*3/uL (ref 140–400)
RBC: 4.45 10*6/uL (ref 4.20–5.82)
RDW: 12.9 % (ref 11.0–14.6)
WBC: 5.8 10*3/uL (ref 4.0–10.3)
lymph#: 1.4 10*3/uL (ref 0.9–3.3)

## 2014-07-12 LAB — COMPREHENSIVE METABOLIC PANEL (CC13)
ALK PHOS: 102 U/L (ref 40–150)
ALT: 25 U/L (ref 0–55)
ANION GAP: 7 meq/L (ref 3–11)
AST: 27 U/L (ref 5–34)
Albumin: 3.7 g/dL (ref 3.5–5.0)
BILIRUBIN TOTAL: 1.26 mg/dL — AB (ref 0.20–1.20)
BUN: 22.2 mg/dL (ref 7.0–26.0)
CO2: 25 mEq/L (ref 22–29)
Calcium: 9.1 mg/dL (ref 8.4–10.4)
Chloride: 107 mEq/L (ref 98–109)
Creatinine: 1.6 mg/dL — ABNORMAL HIGH (ref 0.7–1.3)
EGFR: 43 mL/min/{1.73_m2} — AB (ref >90)
Glucose: 191 mg/dl — ABNORMAL HIGH (ref 70–140)
Potassium: 4.4 mEq/L (ref 3.5–5.1)
SODIUM: 139 meq/L (ref 136–145)
Total Protein: 7 g/dL (ref 6.4–8.3)

## 2014-07-12 MED ORDER — IOHEXOL 300 MG/ML  SOLN
100.0000 mL | Freq: Once | INTRAMUSCULAR | Status: AC | PRN
  Administered 2014-07-12: 100 mL via INTRAVENOUS

## 2014-07-14 ENCOUNTER — Telehealth: Payer: Self-pay | Admitting: Oncology

## 2014-07-14 ENCOUNTER — Ambulatory Visit: Payer: Medicare Other | Admitting: Oncology

## 2014-07-14 NOTE — Telephone Encounter (Signed)
Confirmed appt for 07/26/14. Pt  r/s missed appt 07/14/14 to 07/26/14.

## 2014-07-26 ENCOUNTER — Ambulatory Visit (HOSPITAL_BASED_OUTPATIENT_CLINIC_OR_DEPARTMENT_OTHER): Payer: Medicare Other | Admitting: Oncology

## 2014-07-26 ENCOUNTER — Telehealth: Payer: Self-pay | Admitting: Oncology

## 2014-07-26 VITALS — BP 139/81 | HR 98 | Temp 98.0°F | Resp 18 | Wt 179.2 lb

## 2014-07-26 DIAGNOSIS — Z08 Encounter for follow-up examination after completed treatment for malignant neoplasm: Secondary | ICD-10-CM

## 2014-07-26 DIAGNOSIS — Z85528 Personal history of other malignant neoplasm of kidney: Secondary | ICD-10-CM

## 2014-07-26 DIAGNOSIS — C649 Malignant neoplasm of unspecified kidney, except renal pelvis: Secondary | ICD-10-CM

## 2014-07-26 NOTE — Progress Notes (Signed)
Hematology and Oncology Follow Up Visit  Bryan Ramos [id number] Dec 05, 1935 79 y.o. 07/26/2014 1:57 PM   Principle Diagnosis: 79 year old gentleman with kidney cancer diagnosed in 2011. He presented with a 5 cm left kidney tumor and now he has possibly stage IV disease with small retroperitoneal lymphadenopathy.   Prior Therapy: He is status post left nephrectomy with the pathology revealed a renal cell carcinoma, clear cell type with Fuhrman grade 3/4 and the pathological staging was T1b Nx.    Current therapy: Observation and surveillance.  Interim History: Mr. Colgan presents today for a followup visit with his wife.  Since his last visit, he reports no new issues. He continues to have excellent appetite and performance status. He exercises regularly planned basketball at least 3 times a week. He does not report any headaches blurred vision or double vision. Did not report any chest pain shortness of breath or difficulty breathing. Is not reporting any abdominal pain or change in his bowel habits. Does not report any constipation or diarrhea. Does not report any frequency urgency or hesitancy. Does not report any muscle or bone pain. Does not report any rashes or lesions. He has not reported any hospitalization or illnesses. Does not report any change in his performance status or quality of life. The rest of his review of system is unremarkable.  Medications: I have reviewed the patient's current medications.  Current Outpatient Prescriptions  Medication Sig Dispense Refill  . aspirin 81 MG tablet Take 81 mg by mouth daily.    Marland Kitchen donepezil (ARICEPT) 5 MG tablet Take 1 tablet (5 mg total) by mouth at bedtime. 90 tablet 1  . insulin lispro protamine-lispro (HUMALOG 75/25 MIX) (75-25) 100 UNIT/ML SUSP injection Inject into the skin.    . Memantine HCl ER 28 MG CP24 Take 28 mg by mouth daily. 90 capsule 6  . Omega-3 Fatty Acids (OMEGA 3 PO) Take 1,200 mg by mouth daily.    Marland Kitchen omeprazole  (PRILOSEC) 20 MG capsule TAKE 1 CAPSULE DAILY 90 capsule 2  . ondansetron (ZOFRAN) 4 MG tablet Take 1 tablet (4 mg total) by mouth every 8 (eight) hours as needed for nausea. 20 tablet 0  . ONE TOUCH ULTRA TEST test strip USE AS DIRECTED 100 each 3  . sildenafil (VIAGRA) 25 MG tablet Take 25 mg by mouth daily as needed for erectile dysfunction.    . sitaGLIPtin (JANUVIA) 100 MG tablet Take 100 mg by mouth daily.    . Sulfacetamide-Sulfur-Sunscreen (ROSAC) 10-5 % CREA Apply 1 application topically 2 (two) times daily. 45 g 3  . valACYclovir (VALTREX) 500 MG tablet Take 1 tablet (500 mg total) by mouth daily. 90 tablet 3  . VYTORIN 10-40 MG per tablet TAKE 1 TABLET AT BEDTIME 100 tablet 3   No current facility-administered medications for this visit.     Allergies:  Allergies  Allergen Reactions  . Ciprofloxacin     Nausea     Past Medical History, Surgical history, Social history, and Family History were reviewed and updated.   Physical Exam: Blood pressure 151/74, pulse 98, temperature 97.7 F (36.5 C), temperature source Oral, resp. rate 18, height 5\' 10"  (1.778 m), weight 149 lb 8 oz (67.813 kg), SpO2 98 %. ECOG: 1 General appearance: alert Head: Normocephalic, without obvious abnormality Neck: no adenopathy Lymph nodes: Cervical, supraclavicular, and axillary nodes normal. Heart:regular rate and rhythm, S1, S2 normal, no murmur, click, rub or gallop Lung:chest clear, no wheezing, rales, normal symmetric air entry Abdomin: soft,  non-tender, without masses or organomegaly EXT:no erythema, induration, or nodules   Lab Results: Lab Results  Component Value Date   WBC 5.8 07/12/2014   HGB 14.1 07/12/2014   HCT 42.7 07/12/2014   MCV 96.0 07/12/2014   PLT 187 07/12/2014     Chemistry      Component Value Date/Time   NA 139 07/12/2014 0846   NA 139 12/02/2013 1006   K 4.4 07/12/2014 0846   K 4.5 12/02/2013 1006   CL 107 12/02/2013 1006   CO2 25 07/12/2014 0846   CO2  26 12/02/2013 1006   BUN 22.2 07/12/2014 0846   BUN 19 12/02/2013 1006   CREATININE 1.6* 07/12/2014 0846   CREATININE 1.4 12/02/2013 1006      Component Value Date/Time   CALCIUM 9.1 07/12/2014 0846   CALCIUM 9.2 12/02/2013 1006   ALKPHOS 102 07/12/2014 0846   ALKPHOS 90 12/02/2013 1006   AST 27 07/12/2014 0846   AST 25 12/02/2013 1006   ALT 25 07/12/2014 0846   ALT 19 12/02/2013 1006   BILITOT 1.26* 07/12/2014 0846   BILITOT 1.7* 12/02/2013 1006      EXAM: CT CHEST, ABDOMEN, AND PELVIS WITH CONTRAST  TECHNIQUE: Multidetector CT imaging of the chest, abdomen and pelvis was performed following the standard protocol during bolus administration of intravenous contrast.  CONTRAST: 165mL OMNIPAQUE IOHEXOL 300 MG/ML SOLN  COMPARISON: 12/31/2013.  FINDINGS: CT CHEST FINDINGS  Soft tissue / Mediastinum: There is no axillary lymphadenopathy. No mediastinal or hilar lymphadenopathy. Heart size is normal. Coronary artery calcification is noted. No pericardial effusion.  Lungs / Pleura: Lungs are clear without pulmonary parenchymal nodule or mass. No focal airspace consolidation or pulmonary edema. No pleural effusion.  Bones: Bone windows reveal no worrisome lytic or sclerotic osseous lesions.  CT ABDOMEN AND PELVIS FINDINGS  Hepatobiliary: No focal abnormality within the liver parenchyma. Calcified gallstones measure up to 10 mm in diameter. No gallbladder wall thickening or pericholecystic fluid. No intrahepatic or extrahepatic biliary dilation.  Pancreas: No focal mass lesion. No dilatation of the main duct. No intraparenchymal cyst. No peripancreatic edema.  Spleen: No splenomegaly. No focal mass lesion.  Adrenals/Urinary Tract: No adrenal nodule or mass. 17 mm hyper attenuating exophytic lesion in the interpolar right kidney is stable and remains compatible with hemorrhagic cyst. No enhancing mass in the right kidney. Left kidney is surgically  absent.  Stomach/Bowel: Stomach is nondistended. No gastric wall thickening. No evidence of outlet obstruction. Duodenum is normally positioned as is the ligament of Treitz. No small bowel wall thickening. No small bowel dilatation. Terminal ileum is normal. Appendix is normal. No gross colonic mass. No colonic wall thickening. No substantial diverticular change.  Vascular/Lymphatic: Atherosclerotic calcification is noted in the wall of the abdominal aorta without aneurysm. Portal vein is patent. Splenic vein is patent.  The enhancing left para-aortic soft tissue nodule with central necrosis has increased slightly in size in the interval, measuring 2.7 cm today (image 37 series 3) in the same dimension in which it measured 2.3 cm previously. No pelvic sidewall lymphadenopathy.  Reproductive: Prostate gland is mildly enlarged. Seminal vesicles are unremarkable.  Other: No intraperitoneal free fluid.  Musculoskeletal: Bone windows reveal no worrisome lytic or sclerotic osseous lesions.  IMPRESSION: 1. Slight interval increase in size of the left para-aortic soft tissue nodule concerning for necrotic lymphadenopathy. Imaging features would suggest progression of metastatic disease. No other evidence of metastatic involvement in the chest, abdomen, or pelvis. 2. Stable appearance of complex  cyst in the posterior right kidney.   Impression and Plan:  79 year old gentleman with: 1. History of kidney cancer and likely relapse disease. His initial diagnosis was in 2011 where he had a 5 cm left kidney tumor. He is status post left nephrectomy with the pathology revealed a renal cell carcinoma, clear cell type with Fuhrman grade 3/4 and the pathological staging was T1b Nx. He has been on active surveillance and an MRI in October of 2014 showed slightly enlarged periaortic lymphadenopathy.   Followup CT scan in June of 2015 showed a 2.3 cm periaortic lymph node that had been  relatively stable from previous examination. CT scan on 07/12/2014 was reviewed and showed very little change in the left periaortic lymph node.  Given his age, comorbidities and very low volume disease I see no reason or indication for systemic disease. At this point, treatment will be worse than the burden of disease at this time. The plan is to continue with active surveillance and repeat imaging studies in 6 months. If he develops widespread disease at that time we'll consider systemic treatment.  2. Followup: Will be in 6 months.    Zola Button, MD 1/12/20161:57 PM

## 2014-07-26 NOTE — Telephone Encounter (Signed)
[facility] pt appt schedule for july. [facility] will call re ct appt. pt/wife aware.

## 2014-08-04 ENCOUNTER — Ambulatory Visit: Payer: Medicare Other | Admitting: Oncology

## 2014-08-18 ENCOUNTER — Encounter: Payer: Self-pay | Admitting: Family Medicine

## 2014-08-18 ENCOUNTER — Ambulatory Visit (INDEPENDENT_AMBULATORY_CARE_PROVIDER_SITE_OTHER): Payer: Medicare Other | Admitting: Family Medicine

## 2014-08-18 VITALS — BP 140/72 | Temp 98.3°F | Wt 189.0 lb

## 2014-08-18 DIAGNOSIS — N529 Male erectile dysfunction, unspecified: Secondary | ICD-10-CM | POA: Insufficient documentation

## 2014-08-18 DIAGNOSIS — R21 Rash and other nonspecific skin eruption: Secondary | ICD-10-CM

## 2014-08-18 DIAGNOSIS — E785 Hyperlipidemia, unspecified: Secondary | ICD-10-CM

## 2014-08-18 DIAGNOSIS — Z66 Do not resuscitate: Secondary | ICD-10-CM

## 2014-08-18 DIAGNOSIS — K219 Gastro-esophageal reflux disease without esophagitis: Secondary | ICD-10-CM | POA: Insufficient documentation

## 2014-08-18 MED ORDER — TRIAMCINOLONE ACETONIDE 0.1 % EX CREA: 1.0000 "application " | TOPICAL_CREAM | Freq: Two times a day (BID) | CUTANEOUS | Status: AC

## 2014-08-18 NOTE — Progress Notes (Signed)
Bryan Reddish, MD Phone: [phone]  Subjective:  Patient presents today to establish care with me as their new primary care provider. Patient was formerly a patient of Dr. Arnoldo Morale. Chief complaint-noted.   Hyperlipidemia-good control on vytorin  Lab Results  Component Value Date   LDLCALC 88 12/02/2013  On statin: yes Regular exercise: plays basketball regularly ROS- no chest pain or shortness of breath. No myalgias  Rash Noted over last few weeks. Slightly itchy. Red spots across back. No new detergents, skin products. Has not tried anything.  ROS-not ill appearing, no fever/chills. No new medications.  No mucus membrane involvement. Does have known malignancy.   The following were reviewed and entered/updated in epic: Past Medical History  Diagnosis Date  . Hyperlipidemia   . HSV infection   . Alzheimer's dementia     dr Erling Cruz  . Diabetes mellitus without complication     seeing [facility]  . Memory change   . Renal cell carcinoma   . History of [name] infection 11/28/2009    Qualifier: Diagnosis of  By: Arnoldo Morale MD, Balinda Quails    Patient Active Problem List   Diagnosis Date Noted  . DNR/DNI 08/18/2014    Priority: High  . Malignant neoplasm of kidney excluding renal pelvis 02/16/2010    Priority: High  . Alzheimer's disease 07/26/2008    Priority: High  . Diabetes mellitus type 2, uncontrolled 02/19/2007    Priority: High  . Hyperlipidemia 02/19/2007    Priority: Medium  . GERD (gastroesophageal reflux disease) 08/18/2014    Priority: Low  . Erectile dysfunction 08/18/2014    Priority: Low  . 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 Herpes simplex virus (HSV) infection 02/13/2007    Priority: Low   Past Surgical History  Procedure Laterality Date  . Nephrectomy      Dr. Mar Daring, left  . Tonsillectomy      Family History  Problem Relation Age of Onset  . Stroke Father   . Alzheimer's disease    . Colon cancer Neg Hx   . Dementia Paternal  Grandfather     Medications- reviewed and updated Current Outpatient Prescriptions  Medication Sig Dispense Refill  . aspirin 81 MG tablet Take 81 mg by mouth daily.    Marland Kitchen donepezil (ARICEPT) 5 MG tablet Take 1 tablet (5 mg total) by mouth at bedtime. 90 tablet 1  . insulin lispro protamine-lispro (HUMALOG 75/25 MIX) (75-25) 100 UNIT/ML SUSP injection Inject into the skin.    . Memantine HCl ER 28 MG CP24 Take 28 mg by mouth daily. 90 capsule 6  . Omega-3 Fatty Acids (OMEGA 3 PO) Take 1,200 mg by mouth daily.    Marland Kitchen omeprazole (PRILOSEC) 20 MG capsule TAKE 1 CAPSULE DAILY 90 capsule 2  . ondansetron (ZOFRAN) 4 MG tablet Take 1 tablet (4 mg total) by mouth every 8 (eight) hours as needed for nausea. 20 tablet 0  . ONE TOUCH ULTRA TEST test strip USE AS DIRECTED 100 each 3  . sildenafil (VIAGRA) 25 MG tablet Take 25 mg by mouth daily as needed for erectile dysfunction.    . sitaGLIPtin (JANUVIA) 100 MG tablet Take 100 mg by mouth daily.    . Sulfacetamide-Sulfur-Sunscreen (ROSAC) 10-5 % CREA Apply 1 application topically 2 (two) times daily. 45 g 3  . triamcinolone cream (KENALOG) 0.1 % Apply 1 application topically 2 (two) times daily. For 2 weeks to back maximum 80 g 1  . valACYclovir (VALTREX) 500 MG tablet  Take 1 tablet (500 mg total) by mouth daily. 90 tablet 3  . VYTORIN 10-40 MG per tablet TAKE 1 TABLET AT BEDTIME 100 tablet 3   No current facility-administered medications for this visit.    Allergies-reviewed and updated Allergies  Allergen Reactions  . Ciprofloxacin     Nausea     History   Social History  . Marital Status: Married    Spouse Name: Margaretha Sheffield    Number of Children: 0  . Years of Education: college   Occupational History  .      Retired   Social History Main Topics  . Smoking status: Never Smoker   . Smokeless tobacco: Never Used  . Alcohol Use: No  . Drug Use: No  . Sexual Activity: None   Other Topics Concern  . None   Social History Narrative    Patient lives at home with his wife Margaretha Sheffield from [place], [place]).      Patient is retired from [employer]. College education. BA.      Hobbies: basketball MWF, TV, occasional grass cutting      Right handed.   Caffeine- one cup daily.    ROS--See HPI   Objective: BP 140/72 mmHg  Temp(Src) 98.3 F (36.8 C)  Wt 189 lb (85.73 kg) Gen: NAD, resting comfortably in chair CV: RRR no murmurs rubs or gallops Lungs: CTAB no crackles, wheeze, rhonchi Abdomen: soft/nontender/nondistended/normal bowel sounds.  Ext: no edema Skin: warm, dry, erythematous macules and papules on back with some excoriation.  Neuro: grossly normal, moves all extremities, PERRLA   Assessment/Plan:  Hyperlipidemia good control on vytorin, continue. We discussed monitoring dementia and kidney cancer, if they progress, consider coming off of vytorin. Patient likely with less than 10 year life expectancy so not clear of benefit of statin at this point for primary prevention.     Rash-possible contact dermatitis. Treat with triamcinolone x 2 weeks cover with vaseline, refer to dermatology if no improvement. Known malignancy and likely some immunosuppressed.   Return precautions advised. 6-12 month follow up.   >50% of 25 minute office visit was spent on counseling and coordination of care. See below.   Discussed and confirmed DNR/DNI during visit. We discussed focusing on quality of life with alzheimer's and renal cancer. Although I am not managing diabetes, we discussed a1c goals of 8.5 or even 9 may be safest given his alzheimers. Definitely want to avoid lows.   Meds ordered this encounter  Medications  . triamcinolone cream (KENALOG) 0.1 %    Sig: Apply 1 application topically 2 (two) times daily. For 2 weeks to back maximum    Dispense:  80 g    Refill:  1

## 2014-08-18 NOTE — Assessment & Plan Note (Addendum)
good control on vytorin, continue. We discussed monitoring dementia and kidney cancer, if they progress, consider coming off of vytorin. Patient likely with less than 10 year life expectancy so not clear of benefit of statin at this point for primary prevention.

## 2014-08-18 NOTE — Patient Instructions (Addendum)
No changes today.   Let's see each other in 6 months to 1 year.   Try this cream for 2 weeks and cover it with vaseline or eucerin. If no improvement, we can send you to dermatology if you desire. Please see [place] if it worsens.

## 2014-09-24 ENCOUNTER — Other Ambulatory Visit: Payer: Self-pay

## 2014-09-24 MED ORDER — MEMANTINE HCL ER 28 MG PO CP24: 28.0000 mg | ORAL_CAPSULE | Freq: Every day | ORAL | Status: DC

## 2014-09-26 ENCOUNTER — Telehealth: Payer: Self-pay

## 2014-09-26 DIAGNOSIS — A6000 Herpesviral infection of urogenital system, unspecified: Secondary | ICD-10-CM

## 2014-09-26 MED ORDER — EZETIMIBE-SIMVASTATIN 10-40 MG PO TABS: 1.0000 | ORAL_TABLET | Freq: Every day | ORAL | Status: AC

## 2014-09-26 MED ORDER — VALACYCLOVIR HCL 500 MG PO TABS: 500.0000 mg | ORAL_TABLET | Freq: Every day | ORAL | Status: AC

## 2014-09-26 MED ORDER — MEMANTINE HCL ER 28 MG PO CP24: 28.0000 mg | ORAL_CAPSULE | Freq: Every day | ORAL | Status: AC

## 2014-09-26 NOTE — Telephone Encounter (Signed)
[facility] refill request for VYTORIN 10/40 TABS #100 with 3 refills

## 2014-09-26 NOTE — Telephone Encounter (Signed)
Medications refilled

## 2014-09-26 NOTE — Telephone Encounter (Signed)
Also for VALACYCLOVIR 500MG  and NAMENDA 10MG 

## 2014-10-11 ENCOUNTER — Other Ambulatory Visit: Payer: Self-pay

## 2014-10-11 MED ORDER — OMEPRAZOLE 20 MG PO CPDR: 20.0000 mg | DELAYED_RELEASE_CAPSULE | Freq: Every day | ORAL | Status: DC

## 2014-10-11 NOTE — Telephone Encounter (Signed)
Rx request for omeprazole 20 mg capsules-Take 1 capsule daily #90  Pharm:  [facility]

## 2014-10-14 ENCOUNTER — Encounter: Payer: Self-pay | Admitting: Adult Health

## 2014-10-20 ENCOUNTER — Ambulatory Visit (INDEPENDENT_AMBULATORY_CARE_PROVIDER_SITE_OTHER): Payer: Medicare Other | Admitting: Adult Health

## 2014-10-20 ENCOUNTER — Encounter: Payer: Self-pay | Admitting: Adult Health

## 2014-10-20 VITALS — BP 159/88 | HR 64 | Ht 70.0 in | Wt 186.0 lb

## 2014-10-20 DIAGNOSIS — R413 Other amnesia: Secondary | ICD-10-CM

## 2014-10-20 NOTE — Progress Notes (Signed)
I have read the note, and I agree with the clinical assessment and plan.  Kya [place] KEITH   

## 2014-10-20 NOTE — Progress Notes (Signed)
PATIENT: Bryan Ramos DOB: [birth year]  REASON FOR VISIT: follow up- memory                                                                                                                                                                                                                                                                                        HISTORY FROM: patient  HISTORY OF PRESENT ILLNESS: Mr. Hitchner is a 79 year old male with a history of memory loss. He returns today for follow-up. He is currently taking Namenda and Aricept. He states that since we decreased Aricept to 5 mg he's been able to tolerate it. Patient feels that overall his memory has remained the same. He is able to complete all ADLs independently. The patient states that he has continued to drive. In the past the patient has had episodes of getting lost when driving to familiar places his wife confirms that he's had at least 3 episodes where he is gotten lost while driving. Patient continues to play basketball weekly. Patient denies having to give up anything due to his memory. However his wife states that he has become less active over the years. Wife states that he continues to have trouble with word finding. The patient states he tends have trouble with "big words." He denies any new neurological symptoms. Denies any new medical issues. He returns today for evaluation  HISTORY 04/21/14: Mr. Sliney is a 79 year old male with a history of memory loss. He returns today for follow-up. he is currently taking Namenda. He was unable to tolerate the Aricept and Namenda together. He reports that his memory has remained the same but his wife feels that it has gotten worse. Wife states that the most difficult thing is he is having word finding issues. He is able to complete all ADLs without assistance. Denies having to give up anything due to his memory. He continues to play basketball at the [facility]. He continues to operates a Journalist, newspaper. Wife states that he does well but he did get lost when he was coming home from basketball. Wife has taken over the finances. He continues to  fix his own breakfast but otherwise his wife does all the cooking. He does have diabetes and tends to snack a lot because he does not remember eating.   HISTORY 10/19/13 (PS): CHARLESTON HANKIN is a 79 y.o. male here as a referral from Dr. Arnoldo Morale for cognitive decline. Last visit was 06/2013 at which time he was started on Aricept 5mg  in addition to [place]. States he has had nausea every time he starts taking Aricept. They feel that the memory is about the same, wife feels slightly worse, slightly worse short term memory. He continues to drive, no difficulties, wife feels he is fine driving. He had one episode of getting lost but otherwise she feels he is doing well. No gait instability, no trips or falls. Prior visit with Dr Erling Cruz [id number]: 79 year old right-handed white married male with a 5 yr history of progressive memory loss and family history of memory loss evaluated with an MRI study of the brain, TSH, B12,and RPR. I thought there was hippocampal atrophy on the MRI of the brain. He is on Aricept 10 mg daily. He was first seen 03/27/09 with MMSE 23/30, clock drawing task 4/4, 16 four legged animals in one minute. Repeat evaluation 05/29/09 with MMSE 26/30, clock drawing test 3/4, and 12 four legged animals in one minute. Evaluation 04/03/11 with MMSE 24/30, clock drawing task 4/4, and animal fluency 11. Evaluation 09/17/2011 MMSE 26/30. CDT 4/4. AFT 16. He is independent with activities of daily living. He lives with his wife, married for 1-1/2 years. He exercises on a regular basis. He uses a brace on his left leg and has restarted playing basketball for one and a half hours at a time, Mondays, Wednesdays, and Fridays.. He has left lateral knee numbness without significant pain.There is no history of drug or alcohol use. He denies depression, falls, hallucinations or  bowel or bladder incontinence. He is sleeping well.He has no falls. He denies depression. He denies problems driving. is not playing card games use a computeror perform memory games. His socialization is traveling to [facility] to see his wife. She states he doesn't talk much improved.He checks his capillary blood glucoses twice per day. 05/21/2012= MMSE 22/30. Clock drawing task 4/4. Animal fluency test 13. [name] index of independence in activities of daily living 6. Lawton-Brody instrumental activities of daily living scale 6. Neuropsychiatric inventory= irritability 4. Geriatric depression scale 0/15. Falls assessment tool score 2.   REVIEW OF SYSTEMS: Out of a complete 14 system review of symptoms, the patient complains only of the following symptoms, and all other reviewed systems are negative.  Runny nose, agitation, confusion, memory loss, dizziness  ALLERGIES: Allergies  Allergen Reactions  . Ciprofloxacin     Nausea     HOME MEDICATIONS: Outpatient Prescriptions Prior to Visit  Medication Sig Dispense Refill  . aspirin 81 MG tablet Take 81 mg by mouth daily.    Marland Kitchen donepezil (ARICEPT) 5 MG tablet Take 1 tablet (5 mg total) by mouth at bedtime. 90 tablet 1  . ezetimibe-simvastatin (VYTORIN) 10-40 MG per tablet Take 1 tablet by mouth at bedtime. 100 tablet 3  . insulin lispro protamine-lispro (HUMALOG 75/25 MIX) (75-25) 100 UNIT/ML SUSP injection Inject into the skin.    . memantine (NAMENDA XR) 28 MG CP24 24 hr capsule Take 1 capsule (28 mg total) by mouth daily. 90 capsule 3  . Omega-3 Fatty Acids (OMEGA 3 PO) Take 1,200 mg by mouth daily.    Marland Kitchen omeprazole (PRILOSEC) 20 MG capsule  Take 1 capsule (20 mg total) by mouth daily. 90 capsule 3  . ondansetron (ZOFRAN) 4 MG tablet Take 1 tablet (4 mg total) by mouth every 8 (eight) hours as needed for nausea. 20 tablet 0  . ONE TOUCH ULTRA TEST test strip USE AS DIRECTED 100 each 3  . sildenafil (VIAGRA) 25 MG tablet Take 25 mg by  mouth daily as needed for erectile dysfunction.    . sitaGLIPtin (JANUVIA) 100 MG tablet Take 100 mg by mouth daily.    . Sulfacetamide-Sulfur-Sunscreen (ROSAC) 10-5 % CREA Apply 1 application topically 2 (two) times daily. 45 g 3  . triamcinolone cream (KENALOG) 0.1 % Apply 1 application topically 2 (two) times daily. For 2 weeks to back maximum 80 g 1  . valACYclovir (VALTREX) 500 MG tablet Take 1 tablet (500 mg total) by mouth daily. 90 tablet 3   No facility-administered medications prior to visit.    PAST MEDICAL HISTORY: Past Medical History  Diagnosis Date  . Hyperlipidemia   . HSV infection   . Alzheimer's dementia     dr Erling Cruz  . Diabetes mellitus without complication     seeing [facility]  . Memory change   . Renal cell carcinoma   . History of [name] infection 11/28/2009    Qualifier: Diagnosis of  By: Arnoldo Morale MD, Balinda Quails     PAST SURGICAL HISTORY: Past Surgical History  Procedure Laterality Date  . Nephrectomy      Dr. Mar Daring, left  . Tonsillectomy      FAMILY HISTORY: Family History  Problem Relation Age of Onset  . Stroke Father   . Alzheimer's disease    . Colon cancer Neg Hx   . Dementia Paternal Grandfather     SOCIAL HISTORY: History   Social History  . Marital Status: Married    Spouse Name: Margaretha Sheffield  . Number of Children: 0  . Years of Education: college   Occupational History  .      Retired   Social History Main Topics  . Smoking status: Never Smoker   . Smokeless tobacco: Never Used  . Alcohol Use: No  . Drug Use: No  . Sexual Activity: Not on file   Other Topics Concern  . Not on file   Social History Narrative   Patient lives at home with his wife Margaretha Sheffield from [place], [place]).      Patient is retired from [employer]. College education. BA.      Hobbies: basketball MWF, TV, occasional grass cutting      Right handed.   Caffeine- one cup daily.      PHYSICAL EXAM  Filed Vitals:    10/20/14 1400  BP: 159/88  Pulse: 64  Height: 5\' 10"  (1.778 m)  Weight: 186 lb (84.369 kg)   Body mass index is 26.69 kg/(m^2).  Generalized: Well developed, in no acute distress   Neurological examination  Mentation: Alert. Follows all commands speech and language fluent. MMSE 16/30 Cranial nerve II-XII: Pupils were equal round reactive to light. Extraocular movements were full, visual field were full on confrontational test. Facial sensation and strength were normal. Uvula tongue midline. Head turning and shoulder shrug  were normal and symmetric. Motor: The motor testing reveals 5 over 5 strength of all 4 extremities. Good symmetric motor tone is noted throughout.  Sensory: Sensory testing is intact to soft touch on all 4 extremities. No evidence of extinction is noted.  Coordination: Cerebellar testing reveals good finger-nose-finger  (  requires multiple prompting) and heel-to-shin bilaterally.  Gait and station: Gait is normal. Tandem gait is normal. Romberg is negative. No drift is seen.  Reflexes: Deep tendon reflexes are symmetric and normal bilaterally.    DIAGNOSTIC DATA (LABS, IMAGING, TESTING) - I reviewed patient records, labs, notes, testing and imaging myself where available.      ASSESSMENT AND PLAN 79 y.o. year old male  has a past medical history of Hyperlipidemia; HSV infection; Alzheimer's dementia; Diabetes mellitus without complication; Memory change; Renal cell carcinoma; and History of [name] infection (11/28/2009). here with:  1. Memory loss  Overall the patient's memory has remained stable. His MMSE today is 16/30 was previously 12/30. He will continue taking Namenda and Aricept. Patient has been advised in the past that his driving should be scrutinize. However the patient does not recall the time that he got lost while driving. I will refer the patient to the [facility] driving rehabilitation services so they can evaluate his driving to see if he is safe to  continue. Patient and wife are amenable to this. Patient advised that if his symptoms worsen or he develops new symptoms he should let us know. Otherwise he will follow-up in 6 months or sooner if needed.  Ward Givens, MSN, NP-C 10/20/2014, 2:12 PM [facility] [address] [place],  [address] [phone]  Note: This document was prepared with digital dictation and possible smart phrase technology. Any transcriptional errors that result from this process are unintentional.

## 2014-10-20 NOTE — Patient Instructions (Signed)
Continue Namenda and Aricept.  I will refer to the [facility] driving rehab services.  If your symptoms worsen please let us know.

## 2014-10-28 ENCOUNTER — Encounter: Payer: Self-pay | Admitting: Adult Health

## 2014-11-01 ENCOUNTER — Other Ambulatory Visit: Payer: Self-pay | Admitting: Adult Health

## 2014-12-01 ENCOUNTER — Telehealth: Payer: Self-pay | Admitting: Family Medicine

## 2014-12-01 NOTE — Telephone Encounter (Signed)
S/w wife she said only wanted to see Dr Yong Channel for allergy issues and a form she dropped off

## 2014-12-01 NOTE — Telephone Encounter (Signed)
Wife states that patient's memory is terrible and he has been lost several times.  Michela Pitcher he refuses to quit driving, however don't have the memory to safely keep driving.

## 2014-12-01 NOTE — Telephone Encounter (Signed)
Wife came in to drop off some paper work and she said pt is having some allergy issues. Nasal congestion,drip,cough. May I use a SDA for tomorrow 12/02/14

## 2014-12-01 NOTE — Telephone Encounter (Signed)
Noted  

## 2014-12-01 NOTE — Telephone Encounter (Signed)
Pt will need an OV to discuss memory issues.

## 2014-12-01 NOTE — Telephone Encounter (Signed)
Yes mam, Bryan Ramos also said something about pt having memory issues. If this is true as well they will need a longer visit.

## 2014-12-02 ENCOUNTER — Encounter: Payer: Self-pay | Admitting: Family Medicine

## 2014-12-02 ENCOUNTER — Ambulatory Visit (INDEPENDENT_AMBULATORY_CARE_PROVIDER_SITE_OTHER): Payer: Medicare Other | Admitting: Family Medicine

## 2014-12-02 DIAGNOSIS — G309 Alzheimer's disease, unspecified: Secondary | ICD-10-CM

## 2014-12-02 DIAGNOSIS — J302 Other seasonal allergic rhinitis: Secondary | ICD-10-CM

## 2014-12-02 DIAGNOSIS — F0280 Dementia in other diseases classified elsewhere without behavioral disturbance: Secondary | ICD-10-CM

## 2014-12-02 DIAGNOSIS — J309 Allergic rhinitis, unspecified: Secondary | ICD-10-CM | POA: Insufficient documentation

## 2014-12-02 MED ORDER — FLUTICASONE PROPIONATE 50 MCG/ACT NA SUSP: 2.0000 | Freq: Every day | NASAL | Status: DC

## 2014-12-02 NOTE — Progress Notes (Signed)
Garret Reddish, MD  Subjective:  Bryan Ramos is a 79 y.o. year old very pleasant male patient who presents with:  Rebound rhinitis Allergic rhinitis -Nose seems to be constantly running and then at other times stops up. Takes afrin all the time and buys several a week. Sneezes a lot. Does a lot of coughing. Denies chest congestion. A few nights wonders if had some wheezing per wife. Watery itchy eyes.   Driving issues in alzheimer's patient Lost 5x due to dementia. Driving evaluation is very expensive. Patient refuses to stop driving  ROS- no shortness of breath or chest pain. No fever/chills, sinus pressure.   Past Medical History- DM II, alzheimers, hyperlipidemia  Medications- reviewed and updated Current Outpatient Prescriptions  Medication Sig Dispense Refill  . aspirin 81 MG tablet Take 81 mg by mouth daily.    Marland Kitchen donepezil (ARICEPT) 5 MG tablet TAKE 1 TABLET AT BEDTIME 90 tablet 1  . ezetimibe-simvastatin (VYTORIN) 10-40 MG per tablet Take 1 tablet by mouth at bedtime. 100 tablet 3  . insulin lispro protamine-lispro (HUMALOG 75/25 MIX) (75-25) 100 UNIT/ML SUSP injection Inject into the skin.    . memantine (NAMENDA XR) 28 MG CP24 24 hr capsule Take 1 capsule (28 mg total) by mouth daily. 90 capsule 3  . Omega-3 Fatty Acids (OMEGA 3 PO) Take 1,200 mg by mouth daily.    Marland Kitchen omeprazole (PRILOSEC) 20 MG capsule Take 1 capsule (20 mg total) by mouth daily. 90 capsule 3  . ONE TOUCH ULTRA TEST test strip USE AS DIRECTED 100 each 3  . sitaGLIPtin (JANUVIA) 100 MG tablet Take 100 mg by mouth daily.    . Sulfacetamide-Sulfur-Sunscreen (ROSAC) 10-5 % CREA Apply 1 application topically 2 (two) times daily. 45 g 3  . triamcinolone cream (KENALOG) 0.1 % Apply 1 application topically 2 (two) times daily. For 2 weeks to back maximum 80 g 1  . ondansetron (ZOFRAN) 4 MG tablet Take 1 tablet (4 mg total) by mouth every 8 (eight) hours as needed for nausea. (Patient not taking: Reported on  12/02/2014) 20 tablet 0  . sildenafil (VIAGRA) 25 MG tablet Take 25 mg by mouth daily as needed for erectile dysfunction.    . valACYclovir (VALTREX) 500 MG tablet Take 1 tablet (500 mg total) by mouth daily. (Patient not taking: Reported on 12/02/2014) 90 tablet 3   No current facility-administered medications for this visit.    Objective: BP 122/70 mmHg  Pulse 72  Temp(Src) 98.2 F (36.8 C)  Wt 185 lb (83.915 kg) Gen: NAD, resting comfortably turbinates with clear rhinorrhea, TM normal, oropharynx with some clear drainage noted. Some watering of eyes. Sneezing during visit.  CV: RRR no murmurs rubs or gallops Lungs: CTAB no crackles, wheeze, rhonchi Abdomen: soft/nontender/nondistended/normal bowel sounds.  Ext: no edema Skin: warm, dry, no rash  Neuro: obvious memory issues  Assessment/Plan:  Alzheimer's disease Did not get driving evaluation due to cost. Ask for me to sign DMV form to have patient evaluated and this was signed today. Had a long discussion with patient about driving and how this could affect his, his wife, or public safety. For now he is determined to drive, license may have to be removed depending on findings.    Allergic rhinitis Rebound rhinitis due to continual afrin use. We will substitute and only use flonase. Anticholinergics not desirable with dementia and could cause cognitive worsening so no claritin, zyrtec, allegra   return depends on needs from [facility] forms  Meds ordered  this encounter  Medications  . fluticasone (FLONASE) 50 MCG/ACT nasal spray    Sig: Place 2 sprays into both nostrils daily.    Dispense:  16 g    Refill:  6

## 2014-12-02 NOTE — Telephone Encounter (Signed)
Pt needs to be scheduled for an OV per Dr. Yong Channel.

## 2014-12-02 NOTE — Assessment & Plan Note (Signed)
Did not get driving evaluation due to cost. Ask for me to sign DMV form to have patient evaluated and this was signed today. Had a long discussion with patient about driving and how this could affect his, his wife, or public safety. For now he is determined to drive, license may have to be removed depending on findings.

## 2014-12-02 NOTE — Telephone Encounter (Signed)
Pt has appt today for allergy will schedule then

## 2014-12-02 NOTE — Assessment & Plan Note (Signed)
Rebound rhinitis due to continual afrin use. We will substitute and only use flonase. Anticholinergics not desirable with dementia and could cause cognitive worsening so no claritin, zyrtec, allegra

## 2014-12-02 NOTE — Patient Instructions (Signed)
Stop nasal spray you are currently taking. I only want you to take the spray I prescribed today twice a day and NO other nasal sprays.   I believe we need to evaluate your ability to drive. Sent form to DMV-we will await forms and decide if we need to see you back to complete the forms so do not schedule an appointment yet.

## 2014-12-08 ENCOUNTER — Telehealth: Payer: Self-pay | Admitting: Oncology

## 2014-12-08 NOTE — Telephone Encounter (Signed)
Spoke with patients wife and she is aware of his new dr Alen Blew appointment

## 2014-12-13 ENCOUNTER — Other Ambulatory Visit: Payer: Self-pay

## 2014-12-13 MED ORDER — FLUTICASONE PROPIONATE 50 MCG/ACT NA SUSP: 2.0000 | Freq: Every day | NASAL | Status: AC

## 2015-01-04 ENCOUNTER — Encounter: Payer: Self-pay | Admitting: Gastroenterology

## 2015-01-24 ENCOUNTER — Other Ambulatory Visit: Payer: Medicare Other

## 2015-01-24 ENCOUNTER — Ambulatory Visit (HOSPITAL_COMMUNITY)
Admission: RE | Admit: 2015-01-24 | Discharge: 2015-01-24 | Disposition: A | Discharge status: Home / Self Care | Payer: Medicare Other | Source: Ambulatory Visit | Attending: Oncology | Admitting: Oncology

## 2015-01-24 ENCOUNTER — Other Ambulatory Visit (HOSPITAL_BASED_OUTPATIENT_CLINIC_OR_DEPARTMENT_OTHER): Payer: Medicare Other

## 2015-01-24 ENCOUNTER — Encounter (HOSPITAL_COMMUNITY): Payer: Self-pay

## 2015-01-24 DIAGNOSIS — C649 Malignant neoplasm of unspecified kidney, except renal pelvis: Secondary | ICD-10-CM | POA: Insufficient documentation

## 2015-01-24 DIAGNOSIS — Z85528 Personal history of other malignant neoplasm of kidney: Secondary | ICD-10-CM

## 2015-01-24 DIAGNOSIS — R934 Abnormal findings on diagnostic imaging of urinary organs: Secondary | ICD-10-CM | POA: Diagnosis not present

## 2015-01-24 DIAGNOSIS — Z905 Acquired absence of kidney: Secondary | ICD-10-CM | POA: Diagnosis not present

## 2015-01-24 DIAGNOSIS — Z08 Encounter for follow-up examination after completed treatment for malignant neoplasm: Secondary | ICD-10-CM | POA: Diagnosis present

## 2015-01-24 DIAGNOSIS — N281 Cyst of kidney, acquired: Secondary | ICD-10-CM | POA: Diagnosis not present

## 2015-01-24 LAB — COMPREHENSIVE METABOLIC PANEL (CC13)
ALT: 24 U/L (ref 0–55)
ANION GAP: 5 meq/L (ref 3–11)
AST: 29 U/L (ref 5–34)
Albumin: 3.6 g/dL (ref 3.5–5.0)
Alkaline Phosphatase: 107 U/L (ref 40–150)
BUN: 22.7 mg/dL (ref 7.0–26.0)
CO2: 26 meq/L (ref 22–29)
CREATININE: 1.5 mg/dL — AB (ref 0.7–1.3)
Calcium: 9.2 mg/dL (ref 8.4–10.4)
Chloride: 109 mEq/L (ref 98–109)
EGFR: 45 mL/min/{1.73_m2} — ABNORMAL LOW (ref >90)
GLUCOSE: 86 mg/dL (ref 70–140)
Potassium: 4.4 mEq/L (ref 3.5–5.1)
Sodium: 140 mEq/L (ref 136–145)
Total Bilirubin: 1.22 mg/dL — ABNORMAL HIGH (ref 0.20–1.20)
Total Protein: 6.8 g/dL (ref 6.4–8.3)

## 2015-01-24 LAB — CBC WITH DIFFERENTIAL/PLATELET
BASO%: 0.9 % (ref 0.0–2.0)
Basophils Absolute: 0.1 10*3/uL (ref 0.0–0.1)
EOS%: 5.8 % (ref 0.0–7.0)
Eosinophils Absolute: 0.4 10*3/uL (ref 0.0–0.5)
HCT: 41.7 % (ref 38.4–49.9)
HEMOGLOBIN: 14.1 g/dL (ref 13.0–17.1)
LYMPH%: 25.5 % (ref 14.0–49.0)
MCH: 32.3 pg (ref 27.2–33.4)
MCHC: 33.8 g/dL (ref 32.0–36.0)
MCV: 95.5 fL (ref 79.3–98.0)
MONO#: 0.6 10*3/uL (ref 0.1–0.9)
MONO%: 10.2 % (ref 0.0–14.0)
NEUT#: 3.5 10*3/uL (ref 1.5–6.5)
NEUT%: 57.6 % (ref 39.0–75.0)
Platelets: 216 10*3/uL (ref 140–400)
RBC: 4.37 10*6/uL (ref 4.20–5.82)
RDW: 13.2 % (ref 11.0–14.6)
WBC: 6 10*3/uL (ref 4.0–10.3)
lymph#: 1.5 10*3/uL (ref 0.9–3.3)

## 2015-01-24 MED ORDER — IOHEXOL 300 MG/ML  SOLN
100.0000 mL | Freq: Once | INTRAMUSCULAR | Status: AC | PRN
  Administered 2015-01-24: 80 mL via INTRAVENOUS

## 2015-01-26 ENCOUNTER — Ambulatory Visit: Payer: Medicare Other | Admitting: Oncology

## 2015-02-01 ENCOUNTER — Telehealth: Payer: Self-pay | Admitting: Oncology

## 2015-02-01 ENCOUNTER — Ambulatory Visit (HOSPITAL_BASED_OUTPATIENT_CLINIC_OR_DEPARTMENT_OTHER): Payer: Medicare Other | Admitting: Oncology

## 2015-02-01 VITALS — BP 150/79 | HR 79 | Temp 98.8°F | Resp 18 | Ht 70.0 in | Wt 192.0 lb

## 2015-02-01 DIAGNOSIS — R918 Other nonspecific abnormal finding of lung field: Secondary | ICD-10-CM

## 2015-02-01 DIAGNOSIS — R590 Localized enlarged lymph nodes: Secondary | ICD-10-CM | POA: Diagnosis present

## 2015-02-01 DIAGNOSIS — Z85528 Personal history of other malignant neoplasm of kidney: Secondary | ICD-10-CM | POA: Diagnosis not present

## 2015-02-01 DIAGNOSIS — R109 Unspecified abdominal pain: Secondary | ICD-10-CM

## 2015-02-01 NOTE — Progress Notes (Signed)
Hematology and Oncology Follow Up Visit  Bryan Ramos [id number] July 15, 1936 79 y.o. 02/01/2015 4:24 PM   Principle Diagnosis: 79 year old gentleman with kidney cancer diagnosed in 2011. He presented with a 5 cm left kidney tumor and now he has possibly stage IV disease with small retroperitoneal lymphadenopathy.   Prior Therapy: He is status post left nephrectomy with the pathology revealed a renal cell carcinoma, clear cell type with Fuhrman grade 3/4 and the pathological staging was T1b Nx.    Current therapy: Observation and surveillance.  Interim History: Bryan Ramos presents today for a followup visit with his wife. Since his last visit, he continues to do well. He continues to have excellent performance status and activity level. He exercises regularly and an [place] without any decline. He does not report any abdominal pain or fullness. He does not report any early satiety.   He does not report any headaches blurred vision or double vision. Did not report any chest pain shortness of breath or difficulty breathing. Is not reporting any abdominal pain or change in his bowel habits. Does not report any constipation or diarrhea. Does not report any frequency urgency or hesitancy. Does not report any muscle or bone pain. Does not report any rashes or lesions. He has not reported any hospitalization or illnesses. Does not report any change in his performance status or quality of life. The rest of his review of system is unremarkable.  Medications: I have reviewed the patient's current medications.  Current Outpatient Prescriptions  Medication Sig Dispense Refill  . aspirin 81 MG tablet Take 81 mg by mouth daily.    Marland Kitchen donepezil (ARICEPT) 5 MG tablet TAKE 1 TABLET AT BEDTIME 90 tablet 1  . ezetimibe-simvastatin (VYTORIN) 10-40 MG per tablet Take 1 tablet by mouth at bedtime. 100 tablet 3  . fluticasone (FLONASE) 50 MCG/ACT nasal spray Place 2 sprays into both nostrils daily. 16 g 6  . insulin  lispro protamine-lispro (HUMALOG 75/25 MIX) (75-25) 100 UNIT/ML SUSP injection Inject into the skin.    . memantine (NAMENDA XR) 28 MG CP24 24 hr capsule Take 1 capsule (28 mg total) by mouth daily. 90 capsule 3  . Omega-3 Fatty Acids (OMEGA 3 PO) Take 1,200 mg by mouth daily.    Marland Kitchen omeprazole (PRILOSEC) 20 MG capsule Take 1 capsule (20 mg total) by mouth daily. 90 capsule 3  . ondansetron (ZOFRAN) 4 MG tablet Take 1 tablet (4 mg total) by mouth every 8 (eight) hours as needed for nausea. (Patient not taking: Reported on 12/02/2014) 20 tablet 0  . ONE TOUCH ULTRA TEST test strip USE AS DIRECTED 100 each 3  . sildenafil (VIAGRA) 25 MG tablet Take 25 mg by mouth daily as needed for erectile dysfunction.    . sitaGLIPtin (JANUVIA) 100 MG tablet Take 100 mg by mouth daily.    . Sulfacetamide-Sulfur-Sunscreen (ROSAC) 10-5 % CREA Apply 1 application topically 2 (two) times daily. 45 g 3  . triamcinolone cream (KENALOG) 0.1 % Apply 1 application topically 2 (two) times daily. For 2 weeks to back maximum 80 g 1  . valACYclovir (VALTREX) 500 MG tablet Take 1 tablet (500 mg total) by mouth daily. (Patient not taking: Reported on 12/02/2014) 90 tablet 3   No current facility-administered medications for this visit.     Allergies:  Allergies  Allergen Reactions  . Ciprofloxacin     Nausea     Past Medical History, Surgical history, Social history, and Family History were reviewed and updated.  Physical Exam: Blood pressure 150/79, pulse 79, temperature 98.8 F (37.1 C), temperature source Oral, resp. rate 18, height 5\' 10"  (1.778 m), weight 192 lb (87.091 kg), SpO2 99 %. ECOG: 1 General appearance: alert awake not in any distress. Head: Normocephalic, without obvious abnormality Neck: no adenopathy Lymph nodes: Cervical, supraclavicular, and axillary nodes normal. Heart:regular rate and rhythm, S1, S2 normal, no murmur, click, rub or gallop Lung:chest clear, no wheezing, rales, normal symmetric  air entry Abdomin: soft, non-tender, without masses or organomegaly EXT:no erythema, induration, or nodules   Lab Results: Lab Results  Component Value Date   WBC 6.0 01/24/2015   HGB 14.1 01/24/2015   HCT 41.7 01/24/2015   MCV 95.5 01/24/2015   PLT 216 01/24/2015     Chemistry      Component Value Date/Time   NA 140 01/24/2015 0947   NA 139 12/02/2013 1006   K 4.4 01/24/2015 0947   K 4.5 12/02/2013 1006   CL 107 12/02/2013 1006   CO2 26 01/24/2015 0947   CO2 26 12/02/2013 1006   BUN 22.7 01/24/2015 0947   BUN 19 12/02/2013 1006   CREATININE 1.5* 01/24/2015 0947   CREATININE 1.4 12/02/2013 1006      Component Value Date/Time   CALCIUM 9.2 01/24/2015 0947   CALCIUM 9.2 12/02/2013 1006   ALKPHOS 107 01/24/2015 0947   ALKPHOS 90 12/02/2013 1006   AST 29 01/24/2015 0947   AST 25 12/02/2013 1006   ALT 24 01/24/2015 0947   ALT 19 12/02/2013 1006   BILITOT 1.22* 01/24/2015 0947   BILITOT 1.7* 12/02/2013 1006     CLINICAL DATA: Left renal cell carcinoma diagnosed 2011. Left nephrectomy  EXAM: CT ABDOMEN AND PELVIS WITH CONTRAST  TECHNIQUE: Multidetector CT imaging of the abdomen and pelvis was performed using the standard protocol following bolus administration of intravenous contrast.  CONTRAST: 37mL OMNIPAQUE IOHEXOL 300 MG/ML SOLN  COMPARISON: 07/12/2014 in  FINDINGS: Lower chest: Lung bases are clear.  Hepatobiliary: No focal hepatic lesion. Small gallstones dependent in the gallbladder. No biliary duct dilatation.  Pancreas: Pancreas is normal. No ductal dilatation. No pancreatic inflammation.  Spleen: Normal spleen  Adrenals/urinary tract: Adrenal glands normal.  Left periaortic enhancing mass measures 3.0 by 2.5 cm increased from 2.7 by 2.3 cm (image 41, series 6). This soft mass at the level of the ligated left renal vein. No additional nodularity within the left nephrectomy bed.  High-density lesion extending from the  posterior aspect of the right kidney which does not demonstrate post-contrast enhancement consistent with a hemorrhagic cyst. This cyst measures 17 mm on image 46, series 4. No enhancing renal lesions on the right.  Stomach/Bowel: Stomach, small bowel, appendix, and cecum are normal. The colon and rectosigmoid colon are normal.  Vascular/Lymphatic: Abdominal aorta is normal caliber. There is no retroperitoneal or periportal lymphadenopathy. No pelvic lymphadenopathy.  Reproductive: Prostate gland is normal.  Musculoskeletal: No aggressive osseous lesion.  Other: No free fluid.  IMPRESSION: 1. Progressive mild enlargement of enhancing soft tissue mass in the periaortic left retroperitoneum is consistent with renal cell carcinoma recurrence / metastasis. 2. Nonenhancing hemorrhagic cyst of the right kidney.   Impression and Plan:  79 year old gentleman with: 1. History of kidney cancer and likely relapse disease. His initial diagnosis was in 2011 where he had a 5 cm left kidney tumor. He is status post left nephrectomy with the pathology revealed a renal cell carcinoma, clear cell type with Fuhrman grade 3/4 and the pathological staging was T1b  Nx. He has been on active surveillance and an MRI in October of 2014 showed slightly enlarged periaortic lymphadenopathy.   Followup CT scan in June of 2015 showed a 2.3 cm periaortic lymph node that had been relatively stable from previous examination. CT scan on 01/2015 was reviewed and showed very little change in the left periaortic lymph node.  Given his age, comorbidities and very low volume disease I see no reason or indication for systemic disease. At this point, treatment will be worse than the burden of disease at this time. The plan is to continue with active surveillance and repeat imaging studies in 6 months. If he develops widespread disease at that time we can consider systemic treatment.  2. Followup: Will be in 6 months.     Zola Button, MD 7/20/20164:24 PM

## 2015-02-01 NOTE — Telephone Encounter (Signed)
Gave adn printed appt sched and avs for pt for Jan 2017.....gave barium

## 2015-03-10 ENCOUNTER — Other Ambulatory Visit: Payer: Self-pay | Admitting: Adult Health

## 2015-04-18 ENCOUNTER — Ambulatory Visit (INDEPENDENT_AMBULATORY_CARE_PROVIDER_SITE_OTHER): Payer: Medicare Other | Admitting: Adult Health

## 2015-04-18 ENCOUNTER — Encounter: Payer: Self-pay | Admitting: Adult Health

## 2015-04-18 VITALS — BP 169/93 | HR 64 | Ht 70.0 in | Wt 195.0 lb

## 2015-04-18 DIAGNOSIS — F0280 Dementia in other diseases classified elsewhere without behavioral disturbance: Secondary | ICD-10-CM

## 2015-04-18 DIAGNOSIS — G309 Alzheimer's disease, unspecified: Secondary | ICD-10-CM | POA: Diagnosis not present

## 2015-04-18 NOTE — Patient Instructions (Signed)
Try increasing Aricept to 2 tablets at bedtime- if nausea occurs then reduce back to 1 tablet. Continue Namenda If your symptoms worsen or you develop new symptoms please let us know.

## 2015-04-18 NOTE — Progress Notes (Signed)
PATIENT: Bryan Ramos DOB: 1936-04-28  REASON FOR VISIT: follow up-memory HISTORY FROM: patient  HISTORY OF PRESENT ILLNESS: Bryan Ramos is a 79 year old male with a history of memory loss. He returns today for follow-up. He continues to take Namenda and Aricept and tolerates these medications well. Patient feels that his memory has remained the same. He is able to complete all ADLs independently. The patient no longer operates a motor vehicle. His wife states that they made the [facility] aware of his memory issues and his license was taken away. Wife has noticed that over the last 6 months she has saw a gradual decline in his memory. She states that they are in the process of potentially moving to [employer]. They are selling their house here locally as well as in [place] and moving into a new residence. She feels that this has affected the patient's memory. The patient is on 5 mg of Aricept because in the past higher dosages has caused him to fill nauseous. The wife states that he has been on 23 milligrams of Aricept and she feels that this dose is what caused his nausea. She also feels that once he changed his medication to bedtime this also improved. The patient returns today for an evaluation.  HISTORY 10/20/14: Bryan Ramos is a 79 year old male with a history of memory loss. He returns today for follow-up. He is currently taking Namenda and Aricept. He states that since we decreased Aricept to 5 mg he's been able to tolerate it. Patient feels that overall his memory has remained the same. He is able to complete all ADLs independently. The patient states that he has continued to drive. In the past the patient has had episodes of getting lost when driving to familiar places his wife confirms that he's had at least 3 episodes where he is gotten lost while driving. Patient continues to play basketball weekly. Patient denies having to give up anything due to his memory. However his wife states that he has  become less active over the years. Wife states that he continues to have trouble with word finding. The patient states he tends have trouble with "big words." He denies any new neurological symptoms. Denies any new medical issues. He returns today for evaluation  HISTORY 04/21/14: Bryan Ramos is a 79 year old male with a history of memory loss. He returns today for follow-up. he is currently taking Namenda. He was unable to tolerate the Aricept and Namenda together. He reports that his memory has remained the same but his wife feels that it has gotten worse. Wife states that the most difficult thing is he is having word finding issues. He is able to complete all ADLs without assistance. Denies having to give up anything due to his memory. He continues to play basketball at the [facility]. He continues to operates a Teacher, music. Wife states that he does well but he did get lost when he was coming home from basketball. Wife has taken over the finances. He continues to fix his own breakfast but otherwise his wife does all the cooking. He does have diabetes and tends to snack a lot because he does not remember eating  REVIEW OF SYSTEMS: Out of a complete 14 system review of symptoms, the patient complains only of the following symptoms, and all other reviewed systems are negative.  Eye itching, frequency of urination, memory loss, confusion  ALLERGIES: Allergies  Allergen Reactions  . Ciprofloxacin     Nausea  HOME MEDICATIONS: Outpatient Prescriptions Prior to Visit  Medication Sig Dispense Refill  . aspirin 81 MG tablet Take 81 mg by mouth daily.    Marland Kitchen donepezil (ARICEPT) 5 MG tablet TAKE 1 TABLET AT BEDTIME 90 tablet 0  . ezetimibe-simvastatin (VYTORIN) 10-40 MG per tablet Take 1 tablet by mouth at bedtime. 100 tablet 3  . fluticasone (FLONASE) 50 MCG/ACT nasal spray Place 2 sprays into both nostrils daily. 16 g 6  . insulin lispro protamine-lispro (HUMALOG 75/25 MIX) (75-25) 100 UNIT/ML SUSP  injection Inject into the skin.    . memantine (NAMENDA XR) 28 MG CP24 24 hr capsule Take 1 capsule (28 mg total) by mouth daily. 90 capsule 3  . Omega-3 Fatty Acids (OMEGA 3 PO) Take 1,200 mg by mouth daily.    Marland Kitchen omeprazole (PRILOSEC) 20 MG capsule Take 1 capsule (20 mg total) by mouth daily. 90 capsule 3  . ondansetron (ZOFRAN) 4 MG tablet Take 1 tablet (4 mg total) by mouth every 8 (eight) hours as needed for nausea. (Patient not taking: Reported on 12/02/2014) 20 tablet 0  . ONE TOUCH ULTRA TEST test strip USE AS DIRECTED 100 each 3  . sildenafil (VIAGRA) 25 MG tablet Take 25 mg by mouth daily as needed for erectile dysfunction.    . sitaGLIPtin (JANUVIA) 100 MG tablet Take 100 mg by mouth daily.    . Sulfacetamide-Sulfur-Sunscreen (ROSAC) 10-5 % CREA Apply 1 application topically 2 (two) times daily. 45 g 3  . triamcinolone cream (KENALOG) 0.1 % Apply 1 application topically 2 (two) times daily. For 2 weeks to back maximum 80 g 1  . valACYclovir (VALTREX) 500 MG tablet Take 1 tablet (500 mg total) by mouth daily. (Patient not taking: Reported on 12/02/2014) 90 tablet 3   No facility-administered medications prior to visit.    PAST MEDICAL HISTORY: Past Medical History  Diagnosis Date  . Hyperlipidemia   . HSV infection   . Alzheimer's dementia     dr Erling Cruz  . Diabetes mellitus without complication     seeing [facility]  . Memory change   . Renal cell carcinoma   . History of [name] infection 11/28/2009    Qualifier: Diagnosis of  By: Arnoldo Morale MD, Balinda Quails     PAST SURGICAL HISTORY: Past Surgical History  Procedure Laterality Date  . Nephrectomy      Dr. Mar Daring, left  . Tonsillectomy      FAMILY HISTORY: Family History  Problem Relation Age of Onset  . Stroke Father   . Alzheimer's disease    . Colon cancer Neg Hx   . Dementia Paternal Grandfather     SOCIAL HISTORY: Social History   Social History  . Marital Status: Married    Spouse Name: Margaretha Sheffield  . Number  of Children: 0  . Years of Education: college   Occupational History  .      Retired   Social History Main Topics  . Smoking status: Never Smoker   . Smokeless tobacco: Never Used  . Alcohol Use: No  . Drug Use: No  . Sexual Activity: Not on file   Other Topics Concern  . Not on file   Social History Narrative   Patient lives at home with his wife Margaretha Sheffield from [place], [place]).      Patient is retired from [employer]. College education. BA.      Hobbies: basketball MWF, TV, occasional grass cutting      Right handed.  Caffeine- one cup daily.      PHYSICAL EXAM  Filed Vitals:   04/18/15 1117  BP: 169/93  Pulse: 64  Height: 5\' 10"  (1.778 m)  Weight: 195 lb (88.451 kg)   Body mass index is 27.98 kg/(m^2).  MMSE - Mini Mental State Exam 04/18/2015 10/20/2014 04/21/2014  Orientation to time 1 3 0  Orientation to Place 1 2 2   Registration 3 3 3   Attention/ Calculation 0 3 0  Recall 0 0 0  Language- name 2 objects 2 2 2   Language- repeat 0 1 0  Language- follow 3 step command 1 0 3  Language- read & follow direction 0 1 1  Write a sentence 0 1 1  Copy design 0 0 0  Total score 8 16 12     Generalized: Well developed, in no acute distress   Neurological examination  Mentation: Alert. Follows all commands speech and language fluent Cranial nerve II-XII: Pupils were equal round reactive to light. Extraocular movements were full, visual field were full on confrontational test. Facial sensation and strength were normal. Uvula tongue midline. Head turning and shoulder shrug  were normal and symmetric. Motor: The motor testing reveals 5 over 5 strength of all 4 extremities. Good symmetric motor tone is noted throughout.  Sensory: Sensory testing is intact to soft touch on all 4 extremities. No evidence of extinction is noted.  Coordination: Cerebellar testing reveals good finger-nose-finger and heel-to-shin bilaterally.  Gait and station: Gait  is normal. Tandem gait is normal. Romberg is negative. No drift is seen.  Reflexes: Deep tendon reflexes are symmetric and normal bilaterally.   DIAGNOSTIC DATA (LABS, IMAGING, TESTING) - I reviewed patient records, labs, notes, testing and imaging myself where available.  Lab Results  Component Value Date   WBC 6.0 01/24/2015   HGB 14.1 01/24/2015   HCT 41.7 01/24/2015   MCV 95.5 01/24/2015   PLT 216 01/24/2015       ASSESSMENT AND PLAN 79 y.o. year old male  has a past medical history of Hyperlipidemia; HSV infection; Alzheimer's dementia; Diabetes mellitus without complication; Memory change; Renal cell carcinoma; and History of [name] infection (11/28/2009). here with:  1. Alzheimer's dementia  The patient's memory score has reduced significantly since the last visit. His MMSE today is 8/30 was previously 16/30. The wife confirms that there has been a decline in the last 6 months. She has contributed this to changes with her living situation. The patient will continue on Namenda. The patient is on Aricept 5 mg daily. We will try increasing this to 2 tablets at bedtime. However if it causes nausea the patient will reduce back to one tablet at bedtime. Patient and wife verbalized understanding. At this time they do not want to schedule a follow-up visit as they will be trying to seek care in [place].  Ward Givens, MSN, NP-C 04/18/2015, 11:22 AM [facility] [address] [place], [place] [address] [phone]

## 2015-04-19 NOTE — Progress Notes (Signed)
I agree with the assessment and plan as directed by NP .The patient is to see the MD in his next RV .   Bret Vanessen, MD

## 2015-06-06 ENCOUNTER — Other Ambulatory Visit: Payer: Self-pay | Admitting: Adult Health

## 2015-06-06 NOTE — Telephone Encounter (Signed)
Last OV note says: Try increasing Aricept to 2 tablets at bedtime- if nausea occurs then reduce back to 1 tablet.

## 2015-06-22 ENCOUNTER — Encounter: Payer: Self-pay | Admitting: *Deleted

## 2015-06-22 ENCOUNTER — Other Ambulatory Visit: Payer: Self-pay | Admitting: *Deleted

## 2015-06-22 ENCOUNTER — Telehealth: Payer: Self-pay | Admitting: *Deleted

## 2015-06-22 NOTE — Progress Notes (Signed)
Per dr Alen Blew, pof sent to schedulers to move O.V. appt up from 1/19 to 1/18 @ 9:15 for f/u. Wife elaine notified.

## 2015-06-22 NOTE — Telephone Encounter (Signed)
No note

## 2015-06-22 NOTE — Telephone Encounter (Signed)
Wife calling to say they have moved 3 and 1/2 hours away from [facility]. They have a ct scan scheduled for jan 17th.here in [place]. Would there be any way that dr Alen Blew could see patient that afternoon or the next day?

## 2015-07-18 ENCOUNTER — Encounter: Payer: Medicare Other | Admitting: Family Medicine

## 2015-07-25 ENCOUNTER — Other Ambulatory Visit: Payer: Medicare Other

## 2015-07-27 ENCOUNTER — Ambulatory Visit: Payer: Medicare Other | Admitting: Oncology

## 2015-08-01 ENCOUNTER — Ambulatory Visit (HOSPITAL_COMMUNITY)
Admission: RE | Admit: 2015-08-01 | Discharge: 2015-08-01 | Disposition: A | Discharge status: Home / Self Care | Payer: Medicare Other | Source: Ambulatory Visit | Attending: Oncology | Admitting: Oncology

## 2015-08-01 ENCOUNTER — Other Ambulatory Visit (HOSPITAL_BASED_OUTPATIENT_CLINIC_OR_DEPARTMENT_OTHER): Payer: Medicare Other

## 2015-08-01 ENCOUNTER — Encounter (HOSPITAL_COMMUNITY): Payer: Self-pay

## 2015-08-01 ENCOUNTER — Other Ambulatory Visit: Payer: Self-pay | Admitting: Oncology

## 2015-08-01 DIAGNOSIS — I700 Atherosclerosis of aorta: Secondary | ICD-10-CM | POA: Diagnosis not present

## 2015-08-01 DIAGNOSIS — Z905 Acquired absence of kidney: Secondary | ICD-10-CM | POA: Diagnosis not present

## 2015-08-01 DIAGNOSIS — C649 Malignant neoplasm of unspecified kidney, except renal pelvis: Secondary | ICD-10-CM

## 2015-08-01 DIAGNOSIS — N289 Disorder of kidney and ureter, unspecified: Secondary | ICD-10-CM | POA: Diagnosis not present

## 2015-08-01 DIAGNOSIS — R109 Unspecified abdominal pain: Secondary | ICD-10-CM | POA: Insufficient documentation

## 2015-08-01 DIAGNOSIS — K8020 Calculus of gallbladder without cholecystitis without obstruction: Secondary | ICD-10-CM | POA: Insufficient documentation

## 2015-08-01 DIAGNOSIS — R918 Other nonspecific abnormal finding of lung field: Secondary | ICD-10-CM

## 2015-08-01 DIAGNOSIS — Z85528 Personal history of other malignant neoplasm of kidney: Secondary | ICD-10-CM | POA: Diagnosis not present

## 2015-08-01 LAB — CBC WITH DIFFERENTIAL/PLATELET
BASO%: 1.4 % (ref 0.0–2.0)
Basophils Absolute: 0.1 10*3/uL (ref 0.0–0.1)
EOS%: 4.6 % (ref 0.0–7.0)
Eosinophils Absolute: 0.3 10*3/uL (ref 0.0–0.5)
HCT: 45.9 % (ref 38.4–49.9)
HGB: 15.3 g/dL (ref 13.0–17.1)
LYMPH%: 22.9 % (ref 14.0–49.0)
MCH: 32 pg (ref 27.2–33.4)
MCHC: 33.3 g/dL (ref 32.0–36.0)
MCV: 96 fL (ref 79.3–98.0)
MONO#: 0.6 10*3/uL (ref 0.1–0.9)
MONO%: 9.7 % (ref 0.0–14.0)
NEUT#: 4.1 10*3/uL (ref 1.5–6.5)
NEUT%: 61.4 % (ref 39.0–75.0)
Platelets: 200 10*3/uL (ref 140–400)
RBC: 4.78 10*6/uL (ref 4.20–5.82)
RDW: 13.2 % (ref 11.0–14.6)
WBC: 6.6 10*3/uL (ref 4.0–10.3)
lymph#: 1.5 10*3/uL (ref 0.9–3.3)

## 2015-08-01 LAB — COMPREHENSIVE METABOLIC PANEL
ALBUMIN: 3.9 g/dL (ref 3.5–5.0)
ALK PHOS: 98 U/L (ref 40–150)
ALT: 29 U/L (ref 0–55)
AST: 33 U/L (ref 5–34)
Anion Gap: 7 mEq/L (ref 3–11)
BUN: 21.5 mg/dL (ref 7.0–26.0)
CO2: 26 mEq/L (ref 22–29)
Calcium: 9.3 mg/dL (ref 8.4–10.4)
Chloride: 108 mEq/L (ref 98–109)
Creatinine: 1.6 mg/dL — ABNORMAL HIGH (ref 0.7–1.3)
EGFR: 42 mL/min/{1.73_m2} — AB (ref >90)
GLUCOSE: 175 mg/dL — AB (ref 70–140)
Potassium: 4.6 mEq/L (ref 3.5–5.1)
SODIUM: 141 meq/L (ref 136–145)
Total Bilirubin: 1.92 mg/dL — ABNORMAL HIGH (ref 0.20–1.20)
Total Protein: 7.5 g/dL (ref 6.4–8.3)

## 2015-08-01 MED ORDER — IOHEXOL 300 MG/ML  SOLN
100.0000 mL | Freq: Once | INTRAMUSCULAR | Status: AC | PRN
  Administered 2015-08-01: 80 mL via INTRAVENOUS

## 2015-08-02 ENCOUNTER — Ambulatory Visit (HOSPITAL_BASED_OUTPATIENT_CLINIC_OR_DEPARTMENT_OTHER): Payer: Medicare Other | Admitting: Oncology

## 2015-08-02 ENCOUNTER — Telehealth: Payer: Self-pay | Admitting: Oncology

## 2015-08-02 VITALS — BP 143/62 | HR 77 | Temp 98.0°F | Resp 18 | Wt 200.0 lb

## 2015-08-02 DIAGNOSIS — R599 Enlarged lymph nodes, unspecified: Secondary | ICD-10-CM

## 2015-08-02 DIAGNOSIS — C649 Malignant neoplasm of unspecified kidney, except renal pelvis: Secondary | ICD-10-CM

## 2015-08-02 DIAGNOSIS — C642 Malignant neoplasm of left kidney, except renal pelvis: Secondary | ICD-10-CM | POA: Diagnosis present

## 2015-08-02 DIAGNOSIS — N2889 Other specified disorders of kidney and ureter: Secondary | ICD-10-CM

## 2015-08-02 NOTE — Progress Notes (Signed)
Hematology and Oncology Follow Up Visit  Bryan Ramos [id number] Jan 16, 1936 80 y.o. 08/02/2015 9:29 AM   Principle Diagnosis: 80 year old gentleman with kidney cancer diagnosed in 2011. He presented with a 5 cm left kidney tumor and now he has possibly stage IV disease with small retroperitoneal lymphadenopathy.   Prior Therapy: He is status post left nephrectomy with the pathology revealed a renal cell carcinoma, clear cell type with Fuhrman grade 3/4 and the pathological staging was T1b Nx.    Current therapy: Observation and surveillance.  Interim History: Bryan Ramos presents today for a followup visit with his wife. Since his last visit, he reports no recent complaints. He is currently residing in [place] and makes and drive with his wife for these appointments. He has not reported any changes in his health or his memory has becoming an issue. He continues to have excellent performance status and activity level. He exercises regularly without any decline. He does not report any abdominal pain or fullness. He does not report any early satiety. Has not reported any major changes in bowel habits or urinary function.  He does not report any headaches blurred vision or double vision. Did not report any chest pain shortness of breath or difficulty breathing. Is not reporting any abdominal pain or change in his bowel habits. Does not report any constipation or diarrhea. Does not report any frequency urgency or hesitancy. Does not report any muscle or bone pain. Does not report any rashes or lesions. He has not reported any hospitalization or illnesses. Does not report any change in his performance status or quality of life. The rest of his review of system is unremarkable.  Medications: I have reviewed the patient's current medications.  Current Outpatient Prescriptions  Medication Sig Dispense Refill  . aspirin 81 MG tablet Take 81 mg by mouth daily.    Marland Kitchen donepezil (ARICEPT) 5 MG tablet Take 2  tablets (10 mg total) by mouth at bedtime. If side effects occur, may reduce back to 5mg  nightly 180 tablet 0  . ezetimibe-simvastatin (VYTORIN) 10-40 MG per tablet Take 1 tablet by mouth at bedtime. 100 tablet 3  . fluticasone (FLONASE) 50 MCG/ACT nasal spray Place 2 sprays into both nostrils daily. 16 g 6  . insulin lispro protamine-lispro (HUMALOG 75/25 MIX) (75-25) 100 UNIT/ML SUSP injection Inject into the skin.    . memantine (NAMENDA XR) 28 MG CP24 24 hr capsule Take 1 capsule (28 mg total) by mouth daily. 90 capsule 3  . Omega-3 Fatty Acids (OMEGA 3 PO) Take 1,200 mg by mouth daily.    . ondansetron (ZOFRAN) 4 MG tablet Take 1 tablet (4 mg total) by mouth every 8 (eight) hours as needed for nausea. 20 tablet 0  . ONE TOUCH ULTRA TEST test strip USE AS DIRECTED 100 each 3  . sildenafil (VIAGRA) 25 MG tablet Take 25 mg by mouth daily as needed for erectile dysfunction.    . sitaGLIPtin (JANUVIA) 100 MG tablet Take 100 mg by mouth daily.    . Sulfacetamide-Sulfur-Sunscreen (ROSAC) 10-5 % CREA Apply 1 application topically 2 (two) times daily. (Patient not taking: Reported on 04/18/2015) 45 g 3  . triamcinolone cream (KENALOG) 0.1 % Apply 1 application topically 2 (two) times daily. For 2 weeks to back maximum 80 g 1  . valACYclovir (VALTREX) 500 MG tablet Take 1 tablet (500 mg total) by mouth daily. 90 tablet 3   No current facility-administered medications for this visit.     Allergies:  Allergies  Allergen Reactions  . Ciprofloxacin     Nausea     Past Medical History, Surgical history, Social history, and Family History were reviewed and updated.   Physical Exam: Blood pressure 143/62, pulse 77, temperature 98 F (36.7 C), temperature source Oral, resp. rate 18, weight 200 lb (90.719 kg), SpO2 99 %. ECOG: 1 General appearance: alert uptake although gentleman without distress. Head: Normocephalic, without obvious abnormality no oral ulcers or lesions. Neck: no  adenopathy Lymph nodes: Cervical, supraclavicular, and axillary nodes normal. Heart:regular rate and rhythm, S1, S2 normal, no murmur, click, rub or gallop Lung:chest clear, no wheezing, rales, normal symmetric air entry Abdomin: soft, non-tender, without masses or organomegaly shifting dullness or ascites. EXT:no erythema, induration, or nodules   Lab Results: Lab Results  Component Value Date   WBC 6.6 08/01/2015   HGB 15.3 08/01/2015   HCT 45.9 08/01/2015   MCV 96.0 08/01/2015   PLT 200 08/01/2015     Chemistry      Component Value Date/Time   NA 141 08/01/2015 0955   NA 139 12/02/2013 1006   K 4.6 08/01/2015 0955   K 4.5 12/02/2013 1006   CL 107 12/02/2013 1006   CO2 26 08/01/2015 0955   CO2 26 12/02/2013 1006   BUN 21.5 08/01/2015 0955   BUN 19 12/02/2013 1006   CREATININE 1.6* 08/01/2015 0955   CREATININE 1.4 12/02/2013 1006      Component Value Date/Time   CALCIUM 9.3 08/01/2015 0955   CALCIUM 9.2 12/02/2013 1006   ALKPHOS 98 08/01/2015 0955   ALKPHOS 90 12/02/2013 1006   AST 33 08/01/2015 0955   AST 25 12/02/2013 1006   ALT 29 08/01/2015 0955   ALT 19 12/02/2013 1006   BILITOT 1.92* 08/01/2015 0955   BILITOT 1.7* 12/02/2013 1006      EXAM: CT CHEST, ABDOMEN, AND PELVIS WITH CONTRAST  TECHNIQUE: Multidetector CT imaging of the chest, abdomen and pelvis was performed following the standard protocol during bolus administration of intravenous contrast.  CONTRAST: 52mL OMNIPAQUE IOHEXOL 300 MG/ML SOLN  COMPARISON: 07/12/2014 chest abdomen and pelvis CT. Abdomen and pelvis CT from 01/24/2015.  FINDINGS: CT CHEST FINDINGS  Mediastinum/Lymph Nodes: There is no axillary lymphadenopathy. No mediastinal lymphadenopathy. There is no hilar lymphadenopathy. The heart size is normal. No pericardial effusion. Coronary artery calcification is noted. The esophagus has normal imaging features.  Lungs/Pleura: No focal airspace consolidation. No  pulmonary edema or pleural effusion. No pulmonary nodule or mass.  Musculoskeletal: Bone windows reveal no worrisome lytic or sclerotic osseous lesions.  CT ABDOMEN PELVIS FINDINGS  Hepatobiliary: No focal abnormality within the liver parenchyma. Calcified gallstones measure up to about 10 mm diameter. No gallbladder wall thickening or pericholecystic fluid. No intrahepatic or extrahepatic biliary dilation.  Pancreas: No focal mass lesion. No dilatation of the main duct. No intraparenchymal cyst. No peripancreatic edema.  Spleen: No splenomegaly. No focal mass lesion.  Adrenals/Urinary Tract: No adrenal nodule or mass. Left kidney is surgically absent. Exophytic 16 mm lesion seen posteriorly in the interpolar left kidney shows no evidence for enhancement after IV contrast administration. Right ureter is unremarkable. The urinary bladder appears normal for the degree of distention.  Stomach/Bowel: Stomach is nondistended. No gastric wall thickening. No evidence of outlet obstruction. Duodenum is normally positioned as is the ligament of Treitz. No small bowel wall thickening. No small bowel dilatation. The terminal ileum is normal. The appendix is normal. Diverticular changes are noted in the left colon without evidence of diverticulitis.  Vascular/Lymphatic: There is abdominal aortic atherosclerosis without aneurysm. No gastrohepatic or hepatoduodenal ligament lymphadenopathy. 2.8 x 3.1 cm left para-aortic lymph node measures only minimally larger than on the most recent comparison study when it measured 2.5 x 3.0 cm. No pelvic sidewall lymphadenopathy.  Reproductive: Prostate gland is mildly enlarged. Seminal vesicles unremarkable.  Other: No intraperitoneal free fluid.  Musculoskeletal: Bone windows reveal no worrisome lytic or sclerotic osseous lesions.  IMPRESSION: 1. Slight interval continued enlargement of the enhancing left para-aortic soft tissue  lesion compatible with renal cell carcinoma recurrent/metastatic disease. No other new or progressive findings. 2. Stable hemorrhagic or proteinaceous cyst (Bosniak II) of the right kidney. 3. Cholelithiasis.     Impression and Plan:  80 year old gentleman with: 1. History of kidney cancer and likely relapse disease. His initial diagnosis was in 2011 where he had a 5 cm left kidney tumor. He is status post left nephrectomy with the pathology revealed a renal cell carcinoma, clear cell type with Fuhrman grade 3/4 and the pathological staging was T1b Nx. He has been on active surveillance and an MRI in October of 2014 showed slightly enlarged periaortic lymphadenopathy.   CT scan on 08/01/2015 was discussed after reviewing it today with the patient and his wife. He continues to have rather stable left periaortic left node enlargement that could suggest metastatic renal cell carcinoma. Given the very little change noted as well as his overall health, I have elected to continue with observation and surveillance. Repeat imaging studies in 6 months is warranted and if he develops rapid progression of his disease, therapy will be as close to that time.  He is currently residing in [place] and contemplating establishing oncology care closer to home. I told him that I would be happy to refer him at any time he feels it is difficult for him to make his appointments with you here. Given the low frequency of visits at this time, it is reasonable to keep his appointments locally here. He is in agreement and certainly will be happy to change that in the future if he prefers to have it done at [facility].   2. Followup: Will be in 6 months.    [facility], MD 1/18/20179:29 AM

## 2015-08-02 NOTE — Telephone Encounter (Signed)
Gave pt appt for 01/30/16. Gave contrast and advised c-sched will call to make ct appt.

## 2015-08-03 ENCOUNTER — Ambulatory Visit: Payer: Medicare Other | Admitting: Oncology

## 2015-09-03 ENCOUNTER — Other Ambulatory Visit: Payer: Self-pay | Admitting: Adult Health

## 2015-11-06 ENCOUNTER — Encounter: Payer: Self-pay | Admitting: Adult Health

## 2015-11-06 NOTE — Telephone Encounter (Signed)
I called the patient's wife. She just wanted to inform us that she is seeking respite care with her insurance. She states that they will pay for the first 60 days of some in-home care then she will have to pay out of pocket. She states that they may request some information from [place] she just wanted to make [place] aware. If they do ask for more information we will have to have her sign a release form.

## 2015-11-08 ENCOUNTER — Encounter: Payer: Self-pay | Admitting: Adult Health

## 2015-11-12 ENCOUNTER — Telehealth: Payer: Self-pay | Admitting: Oncology

## 2015-11-12 NOTE — Telephone Encounter (Signed)
lvm for pt regarding appts moved from 7.18 to 7.21 due to MD call day....mailed pt appt sched and letter

## 2015-12-01 ENCOUNTER — Other Ambulatory Visit: Payer: Self-pay | Admitting: Adult Health

## 2016-01-30 ENCOUNTER — Ambulatory Visit: Payer: Medicare Other | Admitting: Oncology

## 2016-01-30 ENCOUNTER — Other Ambulatory Visit: Payer: Medicare Other

## 2016-02-02 ENCOUNTER — Ambulatory Visit (HOSPITAL_COMMUNITY)
Admission: RE | Admit: 2016-02-02 | Discharge: 2016-02-02 | Disposition: A | Discharge status: Home / Self Care | Payer: Medicare Other | Source: Ambulatory Visit | Attending: Oncology | Admitting: Oncology

## 2016-02-02 ENCOUNTER — Other Ambulatory Visit: Payer: Self-pay | Admitting: Oncology

## 2016-02-02 ENCOUNTER — Ambulatory Visit (HOSPITAL_BASED_OUTPATIENT_CLINIC_OR_DEPARTMENT_OTHER): Payer: Medicare Other | Admitting: Oncology

## 2016-02-02 ENCOUNTER — Other Ambulatory Visit: Payer: Self-pay | Admitting: *Deleted

## 2016-02-02 ENCOUNTER — Encounter (HOSPITAL_COMMUNITY): Payer: Self-pay

## 2016-02-02 ENCOUNTER — Other Ambulatory Visit (HOSPITAL_BASED_OUTPATIENT_CLINIC_OR_DEPARTMENT_OTHER): Payer: Medicare Other

## 2016-02-02 ENCOUNTER — Ambulatory Visit: Payer: Medicare Other | Admitting: Oncology

## 2016-02-02 VITALS — BP 127/86 | HR 65 | Temp 98.4°F | Resp 18 | Ht 70.0 in | Wt 192.6 lb

## 2016-02-02 DIAGNOSIS — Z905 Acquired absence of kidney: Secondary | ICD-10-CM | POA: Diagnosis not present

## 2016-02-02 DIAGNOSIS — N2889 Other specified disorders of kidney and ureter: Secondary | ICD-10-CM

## 2016-02-02 DIAGNOSIS — R599 Enlarged lymph nodes, unspecified: Secondary | ICD-10-CM

## 2016-02-02 DIAGNOSIS — C649 Malignant neoplasm of unspecified kidney, except renal pelvis: Secondary | ICD-10-CM

## 2016-02-02 DIAGNOSIS — I700 Atherosclerosis of aorta: Secondary | ICD-10-CM | POA: Insufficient documentation

## 2016-02-02 DIAGNOSIS — I2510 Atherosclerotic heart disease of native coronary artery without angina pectoris: Secondary | ICD-10-CM | POA: Diagnosis not present

## 2016-02-02 DIAGNOSIS — Z8553 Personal history of malignant neoplasm of renal pelvis: Secondary | ICD-10-CM

## 2016-02-02 DIAGNOSIS — N281 Cyst of kidney, acquired: Secondary | ICD-10-CM | POA: Insufficient documentation

## 2016-02-02 DIAGNOSIS — K5730 Diverticulosis of large intestine without perforation or abscess without bleeding: Secondary | ICD-10-CM | POA: Diagnosis not present

## 2016-02-02 LAB — COMPREHENSIVE METABOLIC PANEL
ALBUMIN: 3.7 g/dL (ref 3.5–5.0)
ALK PHOS: 112 U/L (ref 40–150)
ALT: 23 U/L (ref 0–55)
ANION GAP: 9 meq/L (ref 3–11)
AST: 32 U/L (ref 5–34)
BUN: 22.4 mg/dL (ref 7.0–26.0)
CALCIUM: 9.2 mg/dL (ref 8.4–10.4)
CHLORIDE: 106 meq/L (ref 98–109)
CO2: 25 mEq/L (ref 22–29)
Creatinine: 1.5 mg/dL — ABNORMAL HIGH (ref 0.7–1.3)
EGFR: 43 mL/min/{1.73_m2} — AB (ref >90)
Glucose: 169 mg/dl — ABNORMAL HIGH (ref 70–140)
POTASSIUM: 4.6 meq/L (ref 3.5–5.1)
Sodium: 140 mEq/L (ref 136–145)
Total Bilirubin: 1.88 mg/dL — ABNORMAL HIGH (ref 0.20–1.20)
Total Protein: 7.2 g/dL (ref 6.4–8.3)

## 2016-02-02 LAB — CBC WITH DIFFERENTIAL/PLATELET
BASO%: 1.1 % (ref 0.0–2.0)
BASOS ABS: 0.1 10*3/uL (ref 0.0–0.1)
EOS ABS: 0.4 10*3/uL (ref 0.0–0.5)
EOS%: 5.4 % (ref 0.0–7.0)
HEMATOCRIT: 43.5 % (ref 38.4–49.9)
HEMOGLOBIN: 14.6 g/dL (ref 13.0–17.1)
LYMPH%: 23.4 % (ref 14.0–49.0)
MCH: 32.2 pg (ref 27.2–33.4)
MCHC: 33.6 g/dL (ref 32.0–36.0)
MCV: 95.8 fL (ref 79.3–98.0)
MONO#: 0.6 10*3/uL (ref 0.1–0.9)
MONO%: 8.6 % (ref 0.0–14.0)
NEUT#: 4.1 10*3/uL (ref 1.5–6.5)
NEUT%: 61.5 % (ref 39.0–75.0)
Platelets: 182 10*3/uL (ref 140–400)
RBC: 4.54 10*6/uL (ref 4.20–5.82)
RDW: 13.4 % (ref 11.0–14.6)
WBC: 6.6 10*3/uL (ref 4.0–10.3)
lymph#: 1.5 10*3/uL (ref 0.9–3.3)

## 2016-02-02 MED ORDER — DIATRIZOATE MEGLUMINE & SODIUM 66-10 % PO SOLN
30.0000 mL | Freq: Once | ORAL | Status: AC
  Administered 2016-02-02: 30 mL via ORAL

## 2016-02-02 MED ORDER — IOPAMIDOL (ISOVUE-300) INJECTION 61%
100.0000 mL | Freq: Once | INTRAVENOUS | Status: AC | PRN
  Administered 2016-02-02: 80 mL via INTRAVENOUS

## 2016-02-02 NOTE — Progress Notes (Signed)
Hematology and Oncology Follow Up Visit  Bryan Ramos [id number] 1936-05-25 80 y.o. 02/02/2016 2:19 PM   Principle Diagnosis: 80 year old gentleman with kidney cancer diagnosed in 2011. Bryan Ramos presented with a 5 cm left kidney tumor and now Bryan Ramos has  stage IV disease with small retroperitoneal lymphadenopathy. Bryan Ramos has a left periaortic lymph node mass that has been stable since 2014.   Prior Therapy: Bryan Ramos is status post left nephrectomy with the pathology revealed a renal cell carcinoma, clear cell type with Fuhrman grade 3/4 and the pathological staging was T1b Nx.    Current therapy: Observation and surveillance.  Interim History: Bryan Ramos presents today for a followup visit with Bryan Ramos wife. Since Bryan Ramos last visit, Bryan Ramos reports no changes in Bryan Ramos health. Bryan Ramos denied any symptoms of abdominal pain or distention. Bryan Ramos denied any constitutional symptoms. Bryan Ramos is currently residing in [place] and makes and drive with Bryan Ramos wife for these appointments. Bryan Ramos has not reported any changes in Bryan Ramos health or Bryan Ramos memory has becoming an issue. Has not reported any major changes in bowel habits or urinary function. Bryan Ramos appetite remains excellent and Bryan Ramos weight is stable.  Bryan Ramos does not report any headaches blurred vision or double vision. Did not report any chest pain shortness of breath or difficulty breathing. Is not reporting any abdominal pain or change in Bryan Ramos bowel habits. Does not report any constipation or diarrhea. Does not report any frequency urgency or hesitancy. Does not report any muscle or bone pain. Does not report any rashes or lesions. Bryan Ramos has not reported any hospitalization or illnesses. Does not report any change in Bryan Ramos performance status or quality of life. The rest of Bryan Ramos review of system is unremarkable.  Medications: I have reviewed the patient's current medications.  Current Outpatient Prescriptions  Medication Sig Dispense Refill  . aspirin 81 MG tablet Take 81 mg by mouth daily.    Marland Kitchen  ezetimibe-simvastatin (VYTORIN) 10-40 MG per tablet Take 1 tablet by mouth at bedtime. 100 tablet 3  . fluticasone (FLONASE) 50 MCG/ACT nasal spray Place 2 sprays into both nostrils daily. 16 g 6  . insulin lispro protamine-lispro (HUMALOG 75/25 MIX) (75-25) 100 UNIT/ML SUSP injection Inject into the skin.    . memantine (NAMENDA XR) 28 MG CP24 24 hr capsule Take 1 capsule (28 mg total) by mouth daily. 90 capsule 3  . Omega-3 Fatty Acids (OMEGA 3 PO) Take 1,200 mg by mouth daily.    . ONE TOUCH ULTRA TEST test strip USE AS DIRECTED 100 each 3  . sitaGLIPtin (JANUVIA) 100 MG tablet Take 100 mg by mouth daily.    . valACYclovir (VALTREX) 500 MG tablet Take 1 tablet (500 mg total) by mouth daily. 90 tablet 3  . atorvastatin (LIPITOR) 40 MG tablet     . donepezil (ARICEPT) 23 MG TABS tablet     . GLUCAGON EMERGENCY 1 MG injection     . ondansetron (ZOFRAN) 4 MG tablet Take 1 tablet (4 mg total) by mouth every 8 (eight) hours as needed for nausea. (Patient not taking: Reported on 02/02/2016) 20 tablet 0  . sildenafil (VIAGRA) 25 MG tablet Take 25 mg by mouth daily as needed for erectile dysfunction. Reported on 02/02/2016    . Sulfacetamide-Sulfur-Sunscreen (ROSAC) 10-5 % CREA Apply 1 application topically 2 (two) times daily. (Patient not taking: Reported on 04/18/2015) 45 g 3  . triamcinolone cream (KENALOG) 0.1 % Apply 1 application topically 2 (two) times daily. For 2 weeks to back maximum (Patient not  taking: Reported on 02/02/2016) 80 g 1   No current facility-administered medications for this visit.     Allergies:  Allergies  Allergen Reactions  . Ciprofloxacin     Nausea     Past Medical History, Surgical history, Social history, and Family History were reviewed and updated.   Physical Exam: Blood pressure 127/86, pulse 65, temperature 98.4 F (36.9 C), temperature source Oral, resp. rate 18, height 5\' 10"  (1.778 m), weight 192 lb 9.6 oz (87.363 kg), SpO2 98 %. ECOG: 1 General  appearance: Alert, awake gentleman without distress. Head: Normocephalic, without obvious abnormality no oral thrush noted. Neck: no adenopathy Lymph nodes: Cervical, supraclavicular, and axillary nodes normal. Heart:regular rate and rhythm, S1, S2 normal, no murmur, click, rub or gallop Lung:chest clear, no wheezing, rales, normal symmetric air entry Abdomin: soft, non-tender, without masses or organomegaly no rebound or guarding. EXT:no erythema, induration, or nodules   Lab Results: Lab Results  Component Value Date   WBC 6.6 02/02/2016   HGB 14.6 02/02/2016   HCT 43.5 02/02/2016   MCV 95.8 02/02/2016   PLT 182 02/02/2016     Chemistry      Component Value Date/Time   NA 140 02/02/2016 0909   NA 139 12/02/2013 1006   K 4.6 02/02/2016 0909   K 4.5 12/02/2013 1006   CL 107 12/02/2013 1006   CO2 25 02/02/2016 0909   CO2 26 12/02/2013 1006   BUN 22.4 02/02/2016 0909   BUN 19 12/02/2013 1006   CREATININE 1.5* 02/02/2016 0909   CREATININE 1.4 12/02/2013 1006      Component Value Date/Time   CALCIUM 9.2 02/02/2016 0909   CALCIUM 9.2 12/02/2013 1006   ALKPHOS 112 02/02/2016 0909   ALKPHOS 90 12/02/2013 1006   AST 32 02/02/2016 0909   AST 25 12/02/2013 1006   ALT 23 02/02/2016 0909   ALT 19 12/02/2013 1006   BILITOT 1.88* 02/02/2016 0909   BILITOT 1.7* 12/02/2013 1006      EXAM: CT ABDOMEN AND PELVIS WITHOUT AND WITH CONTRAST  TECHNIQUE: Multidetector CT imaging of the abdomen and pelvis was performed following the standard protocol before and following the bolus administration of intravenous contrast.  CONTRAST: 36mL ISOVUE-300 IOPAMIDOL (ISOVUE-300) INJECTION 61%  COMPARISON: CT the abdomen and pelvis 08/01/2015.  FINDINGS: Lower chest: Atherosclerotic calcifications in the left anterior stenting and right coronary arteries.  Hepatobiliary: No cystic or solid hepatic lesions. No intra or extrahepatic several calcified gallstones lie dependently in  the gallbladder. No findings to suggest an acute cholecystitis at this time.  Pancreas: No pancreatic mass. No pancreatic ductal dilatation. No pancreatic or peripancreatic fluid or inflammatory changes.  Spleen: Unremarkable.  Adrenals/Urinary Tract: Status post left nephrectomy. In the left para-aortic region there is again a well-circumscribed 3.2 x 2.9 x 3.4 cm heterogeneously enhancing soft tissue lesion which has some internal cystic areas, which has slightly increased in size compared to the prior study (previously 2.8 x 3.1 cm), compatible with locally recurrent renal cell carcinoma. This appears closely associated with the left renal vein, which remains patent. No definite tumor thrombus extending into the left renal vein. 1.7 cm exophytic high attenuation nonenhancing lesion in the posterior aspect of the interpolar region of the right kidney is compatible with a proteinaceous/hemorrhagic cyst, and is very similar to prior study 08/01/2015. No new suspicious right renal lesions. Bilateral adrenal glands are normal in appearance. No right-sided hydroureteronephrosis. The urinary bladder is normal in appearance.  Stomach/Bowel: Normal appearance of the stomach.  No pathologic dilatation of small bowel or colon. Numerous colonic diverticulae are noted, without surrounding inflammatory changes to suggest an acute diverticulitis at this time. The appendix is not confidently identified and may be surgically absent. Regardless, there are no inflammatory changes noted adjacent to the cecum to suggest the presence of an acute appendicitis at this time.  Vascular/Lymphatic: Aortic atherosclerosis, without evidence of aneurysm or dissection in the abdominal or pelvic vasculature. Other than the soft tissue mass in the left para-aortic station which may be nodal in origin, there are no additional pathologically enlarged lymph nodes noted in the abdomen or  pelvis.  Reproductive: Prostate gland seminal vesicles are unremarkable in appearance. Bilateral vasectomy clips are noted.  Other: No significant volume of ascites. No pneumoperitoneum.  Musculoskeletal: There are no aggressive appearing lytic or blastic lesions noted in the visualized portions of the skeleton.  IMPRESSION: 1. Continued slow interval growth of what is now a 3.2 x 2.9 x 3.4 cm enhancing mass in the left para-aortic region, highly concerning for locally recurrent renal cell carcinoma in this patient status post left nephrectomy. No new signs of metastatic disease elsewhere in the abdomen or pelvis. 2. Proteinaceous/hemorrhagic cyst in the right kidney is unchanged. 3. Aortic atherosclerosis, in addition to at least 2 vessel coronary artery disease. Assessment for potential risk factor modification, dietary therapy or pharmacologic therapy may be warranted, if clinically indicated. 4. Colonic diverticulosis without evidence of acute diverticulitis at this time. 5. Additional incidental findings, as above.     Impression and Plan:  80 year old gentleman with: 1. History of kidney cancer and likely relapse disease. Bryan Ramos initial diagnosis was in 2011 where Bryan Ramos had a 5 cm left kidney tumor. Bryan Ramos is status post left nephrectomy with the pathology revealed a renal cell carcinoma, clear cell type with Fuhrman grade 3/4 and the pathological staging was T1b Nx. Bryan Ramos has been on active surveillance and an MRI in October of 2014 showed slightly enlarged periaortic lymphadenopathy.   CT scan on 02/02/2016 was reviewed personally was discussed after reviewing it today with the patient and Bryan Ramos wife. Bryan Ramos continues to have periaortic left node enlargement that could suggest metastatic renal cell carcinoma. This has not changed dramatically over the last 3 years without intervention. No other disease detected anywhere on Bryan Ramos scans. Given the slow growth of this tumor, I have elected  continued observation and surveillance and you systemic therapy upon symptomatic progression.Marland Kitchen  Bryan Ramos is currently residing in [place] and prefers establishing oncology care closer to home. We will make the appropriate referral to a local oncology group and I'm happy to see Bryan Ramos in the future as needed. I have recommended repeat imaging studies in 6 months sooner if there is any symptoms.   2. Followup: Will be as needed.   Zola Button, MD 7/21/20172:19 PM

## 2016-02-05 ENCOUNTER — Encounter: Payer: Self-pay | Admitting: *Deleted

## 2016-02-05 NOTE — Progress Notes (Unsigned)
Left message with dr Blair Dolphin nurse to please call me. Need to refer a patient. Have not received return call from scheduling.[phone] tammi dr arb's nurse called and left VM. Gave fax # to send chart and demographics. And ins info.  faxed to [phone]

## 2016-02-06 ENCOUNTER — Encounter: Payer: Self-pay | Admitting: *Deleted

## 2016-02-15 ENCOUNTER — Encounter: Payer: Self-pay | Admitting: *Deleted

## 2016-02-15 NOTE — Progress Notes (Unsigned)
Faxed back completed referral form with patient and physician's info. To [phone] Requested path slides and CD of scans to be mailed to [facility]. [address], [place] Patient's appt will be sept 25, 2017 @ 11:00 with Dr Arlyss Repress. Patient notified of date and time.

## 2016-09-04 IMAGING — CT CT ABD-PELV W/ CM
2 of 12 series · 12 of 46 positions shown, 19 images · IV contrast (OMNIPAQUE)
Comparison: 07/12/2014 in

CLINICAL DATA: Left renal cell carcinoma diagnosed 6222. Left
nephrectomy

EXAM:
CT ABDOMEN AND PELVIS WITH CONTRAST
TECHNIQUE: Multidetector CT imaging of the abdomen and pelvis was performed
using the standard protocol following bolus administration of
intravenous contrast.
CONTRAST:  80mL OMNIPAQUE IOHEXOL 300 MG/ML  SOLN

[Series 6: venous · axial · portal-venous · 0.84mm/px · z∈[-590,-194]mm · 11 of 158 slices shown, 17 images]
[im 13/158  soft-tissue]
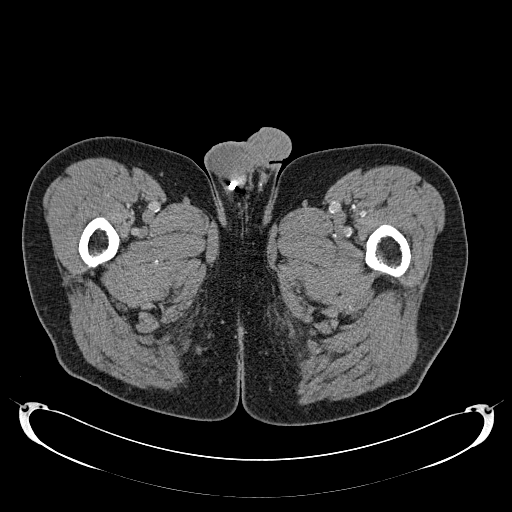
[im 13/158  bone]
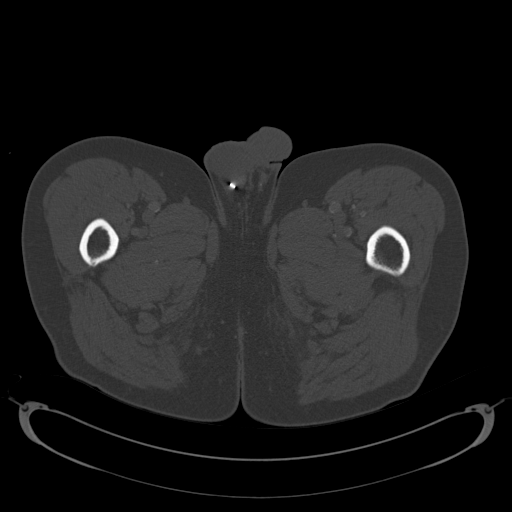
[im 25/158  soft-tissue]
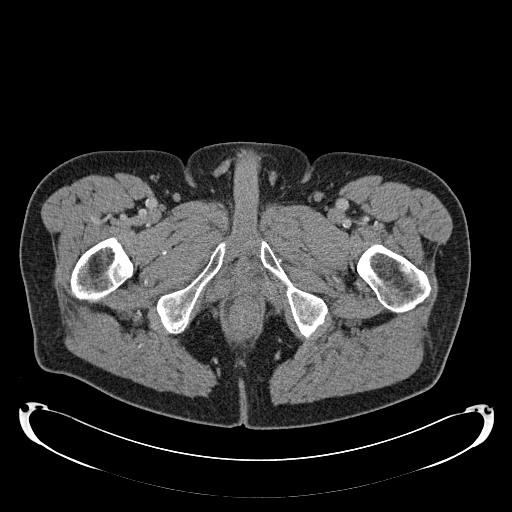
[im 37/158  soft-tissue]
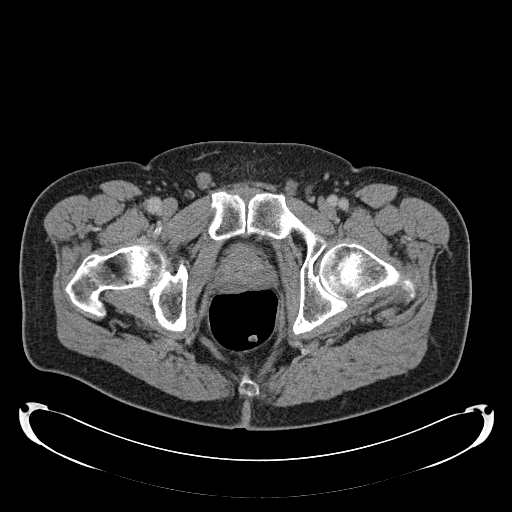
[im 49/158  soft-tissue]
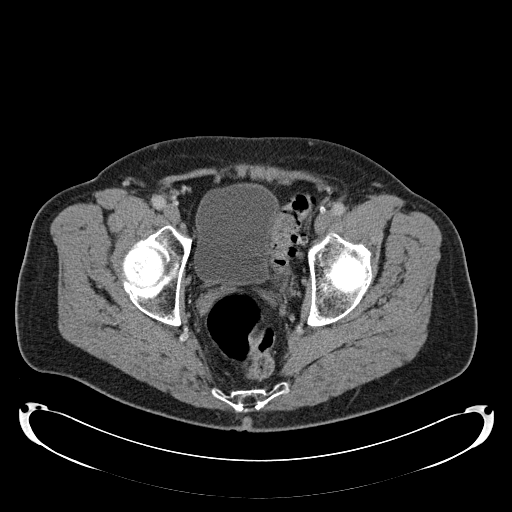
[im 61/158  soft-tissue]
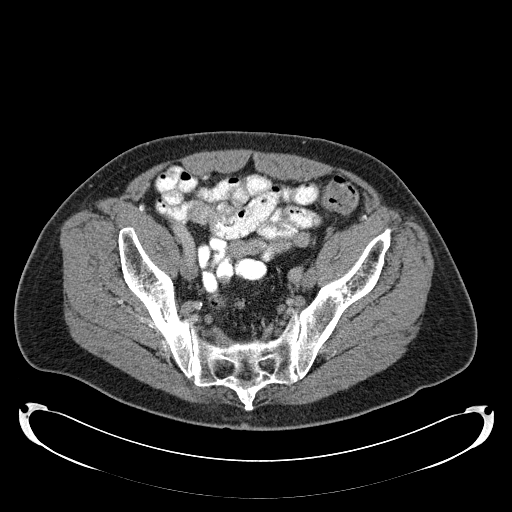
[im 85/158  soft-tissue]
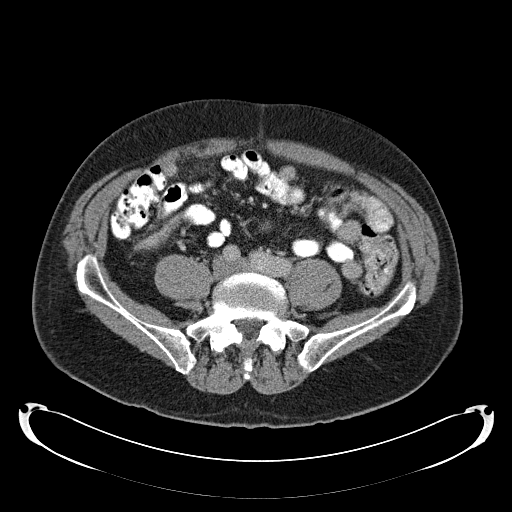
[im 97/158  soft-tissue]
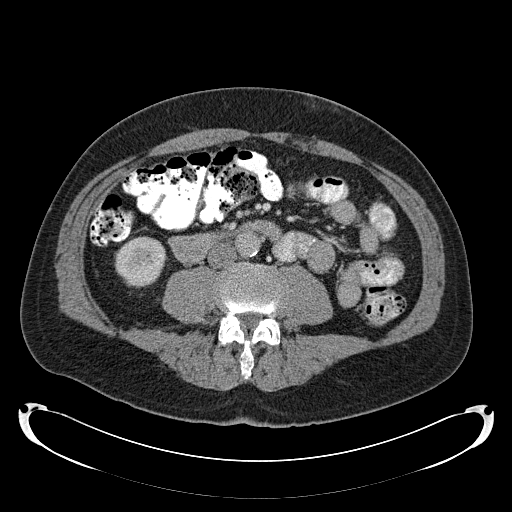
[im 109/158  soft-tissue]
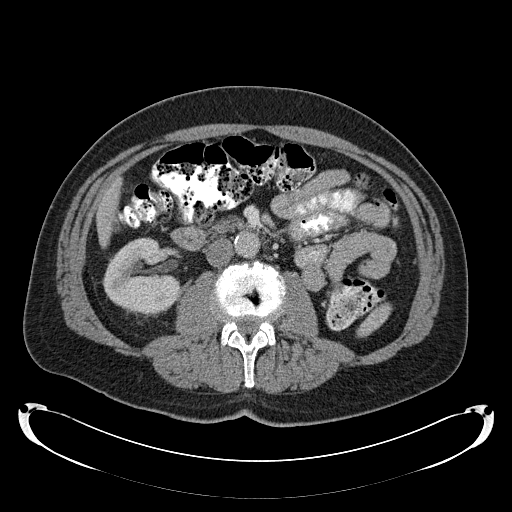
[im 109/158  lung]
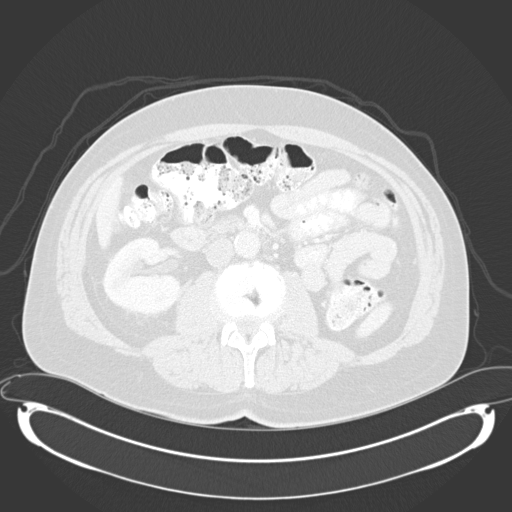
[im 121/158  soft-tissue]
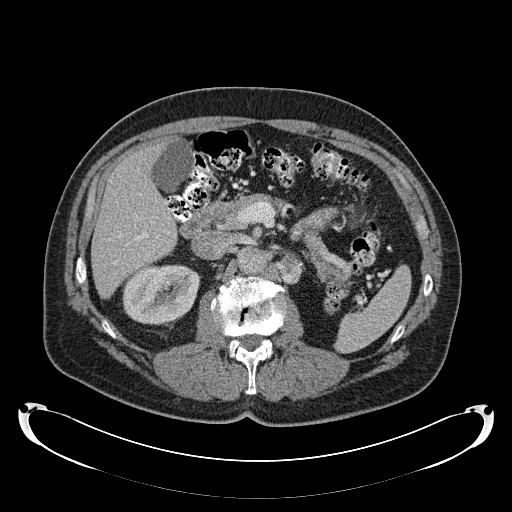
[im 121/158  lung]
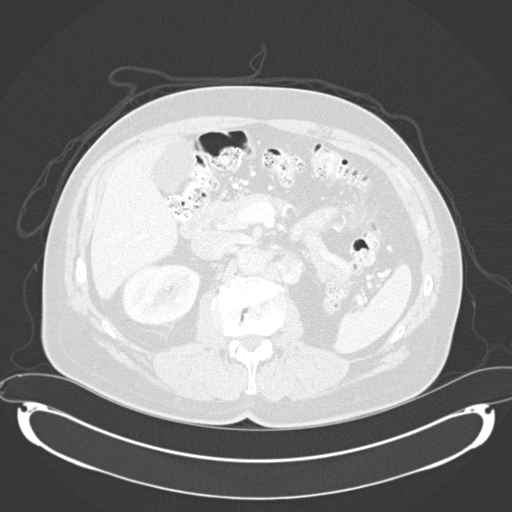
[im 121/158  bone]
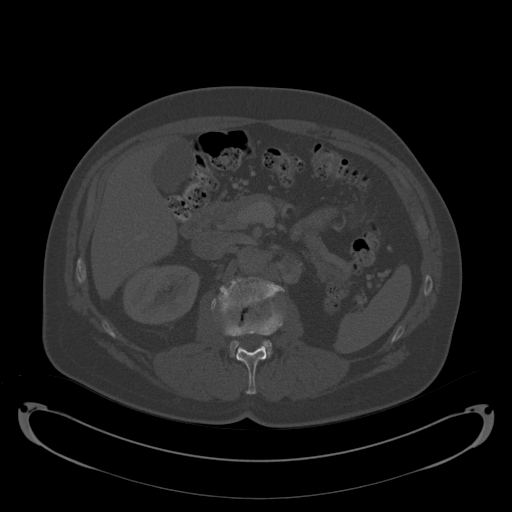
[im 133/158  soft-tissue]
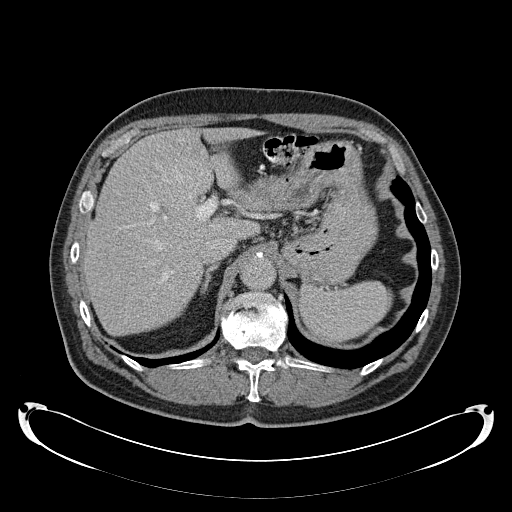
[im 133/158  lung]
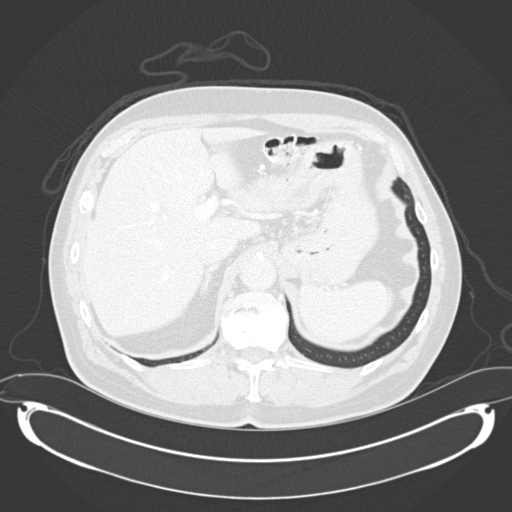
[im 145/158  soft-tissue]
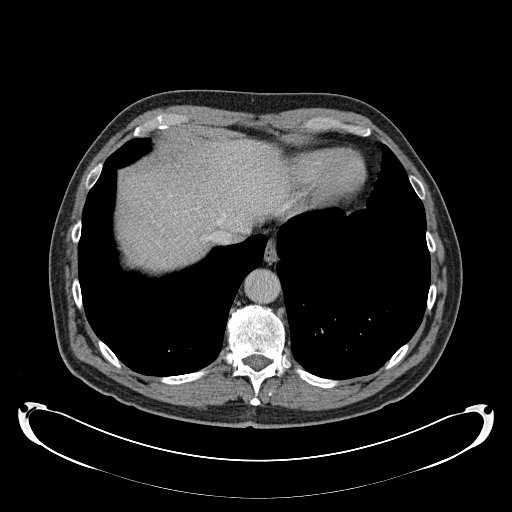
[im 145/158  lung]
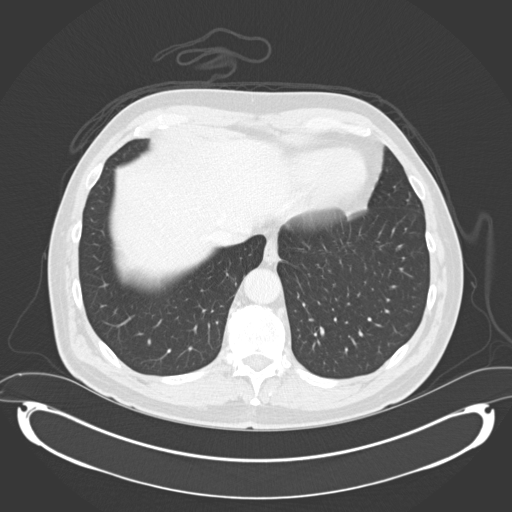

[Series 604: <mpr thick range(2)> · coronal · 0.84mm/px · 1 of 95 slices shown, 2 images]
[im 48/95  soft-tissue]
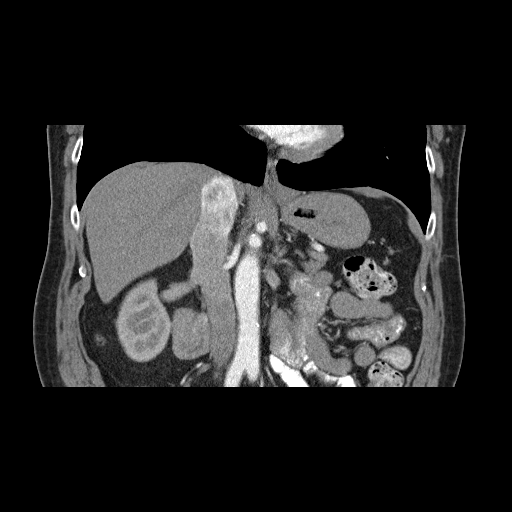
[im 48/95  bone]
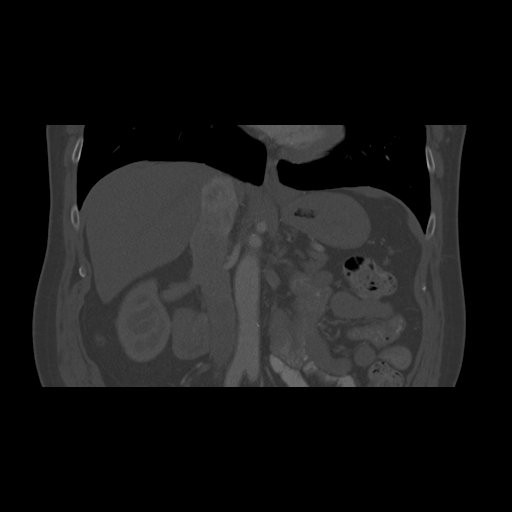

[12 of 46 positions shown; findings below may reference images not displayed]

FINDINGS: Lower chest: Lung bases are clear.

Hepatobiliary: No focal hepatic lesion. Small gallstones dependent
in the gallbladder. No biliary duct dilatation.

Pancreas: Pancreas is normal. No ductal dilatation. No pancreatic
inflammation.

Spleen: Normal spleen

Adrenals/urinary tract: Adrenal glands normal.

Left periaortic enhancing mass measures 3.0 by 2.5 cm increased from
2.7 by 2.3 cm (image 41, series 6). This soft mass at the level of
the ligated left renal vein. No additional nodularity within the
left nephrectomy bed.

High-density lesion extending from the posterior aspect of the right
kidney which does not demonstrate post-contrast enhancement
consistent with a hemorrhagic cyst. This cyst measures 17 mm on
image 46, series 4. No enhancing renal lesions on the right.

Stomach/Bowel: Stomach, small bowel, appendix, and cecum are normal.
The colon and rectosigmoid colon are normal.

Vascular/Lymphatic: Abdominal aorta is normal caliber. There is no
retroperitoneal or periportal lymphadenopathy. No pelvic
lymphadenopathy.

Reproductive: Prostate gland is normal.

Musculoskeletal: No aggressive osseous lesion.

Other: No free fluid.
IMPRESSION: 1. Progressive mild enlargement of enhancing soft tissue mass in the
periaortic left retroperitoneum is consistent with renal cell
carcinoma recurrence / metastasis.
2. Nonenhancing hemorrhagic cyst of the right kidney.

## 2017-08-19 ENCOUNTER — Encounter: Payer: Self-pay | Admitting: Gastroenterology

## 2018-01-12 DEATH — deceased
